# Patient Record
Sex: Female | Born: 1985 | Hispanic: Yes | Marital: Married | Smoking: Never smoker
Health system: Southern US, Community
[De-identification: ages and names within clinical notes are randomized; demographics above are authoritative.]

## PROBLEM LIST (undated history)

## (undated) DIAGNOSIS — E282 Polycystic ovarian syndrome: Secondary | ICD-10-CM

## (undated) DIAGNOSIS — I10 Essential (primary) hypertension: Secondary | ICD-10-CM

## (undated) DIAGNOSIS — J45909 Unspecified asthma, uncomplicated: Secondary | ICD-10-CM

## (undated) DIAGNOSIS — T7840XA Allergy, unspecified, initial encounter: Secondary | ICD-10-CM

## (undated) DIAGNOSIS — M9420 Chondromalacia, unspecified site: Secondary | ICD-10-CM

## (undated) HISTORY — DX: Essential (primary) hypertension: I10

## (undated) HISTORY — DX: Polycystic ovarian syndrome: E28.2

## (undated) HISTORY — DX: Allergy, unspecified, initial encounter: T78.40XA

## (undated) HISTORY — DX: Chondromalacia, unspecified site: M94.20

## (undated) HISTORY — DX: Unspecified asthma, uncomplicated: J45.909

---

## 2017-03-14 ENCOUNTER — Emergency Department (HOSPITAL_COMMUNITY): Payer: Self-pay

## 2017-03-14 ENCOUNTER — Emergency Department (HOSPITAL_COMMUNITY)
Admission: EM | Admit: 2017-03-14 | Discharge: 2017-03-14 | Disposition: A | Discharge status: Home / Self Care | Payer: Self-pay | Attending: Emergency Medicine | Admitting: Emergency Medicine

## 2017-03-14 ENCOUNTER — Encounter (HOSPITAL_COMMUNITY): Payer: Self-pay | Admitting: Emergency Medicine

## 2017-03-14 DIAGNOSIS — R1011 Right upper quadrant pain: Secondary | ICD-10-CM | POA: Insufficient documentation

## 2017-03-14 DIAGNOSIS — B2790 Infectious mononucleosis, unspecified without complication: Secondary | ICD-10-CM | POA: Insufficient documentation

## 2017-03-14 DIAGNOSIS — R17 Unspecified jaundice: Secondary | ICD-10-CM | POA: Insufficient documentation

## 2017-03-14 DIAGNOSIS — Z79899 Other long term (current) drug therapy: Secondary | ICD-10-CM | POA: Insufficient documentation

## 2017-03-14 LAB — URINALYSIS, ROUTINE W REFLEX MICROSCOPIC
BILIRUBIN URINE: NEGATIVE
GLUCOSE, UA: NEGATIVE mg/dL
HGB URINE DIPSTICK: NEGATIVE
Ketones, ur: NEGATIVE mg/dL
Leukocytes, UA: NEGATIVE
NITRITE: NEGATIVE
PH: 5 (ref 5.0–8.0)
Protein, ur: NEGATIVE mg/dL
SPECIFIC GRAVITY, URINE: 1.012 (ref 1.005–1.030)

## 2017-03-14 LAB — CBC WITH DIFFERENTIAL/PLATELET
Basophils Absolute: 0.5 10*3/uL — ABNORMAL HIGH (ref 0.0–0.1)
Basophils Relative: 3 %
Eosinophils Absolute: 0 10*3/uL (ref 0.0–0.7)
Eosinophils Relative: 0 %
HEMATOCRIT: 35.5 % — AB (ref 36.0–46.0)
HEMOGLOBIN: 11.5 g/dL — AB (ref 12.0–15.0)
LYMPHS ABS: 10.9 10*3/uL — AB (ref 0.7–4.0)
LYMPHS PCT: 70 %
MCH: 27.5 pg (ref 26.0–34.0)
MCHC: 32.4 g/dL (ref 30.0–36.0)
MCV: 84.9 fL (ref 78.0–100.0)
MONOS PCT: 10 %
Monocytes Absolute: 1.6 10*3/uL — ABNORMAL HIGH (ref 0.1–1.0)
NEUTROS ABS: 2.7 10*3/uL (ref 1.7–7.7)
Neutrophils Relative %: 17 %
Platelets: 326 10*3/uL (ref 150–400)
RBC: 4.18 MIL/uL (ref 3.87–5.11)
RDW: 15.4 % (ref 11.5–15.5)
WBC: 15.7 10*3/uL — ABNORMAL HIGH (ref 4.0–10.5)

## 2017-03-14 LAB — HEPATIC FUNCTION PANEL
ALBUMIN: 3.3 g/dL — AB (ref 3.5–5.0)
ALK PHOS: 282 U/L — AB (ref 38–126)
ALT: 185 U/L — ABNORMAL HIGH (ref 14–54)
AST: 107 U/L — ABNORMAL HIGH (ref 15–41)
BILIRUBIN DIRECT: 2 mg/dL — AB (ref 0.1–0.5)
BILIRUBIN TOTAL: 3.6 mg/dL — AB (ref 0.3–1.2)
Indirect Bilirubin: 1.6 mg/dL — ABNORMAL HIGH (ref 0.3–0.9)
Total Protein: 8.2 g/dL — ABNORMAL HIGH (ref 6.5–8.1)

## 2017-03-14 LAB — BASIC METABOLIC PANEL
Anion gap: 8 (ref 5–15)
BUN: 11 mg/dL (ref 6–20)
CALCIUM: 8.7 mg/dL — AB (ref 8.9–10.3)
CHLORIDE: 104 mmol/L (ref 101–111)
CO2: 24 mmol/L (ref 22–32)
Creatinine, Ser: 0.68 mg/dL (ref 0.44–1.00)
GFR calc Af Amer: >60 mL/min (ref >60)
GFR calc non Af Amer: >60 mL/min (ref >60)
Glucose, Bld: 81 mg/dL (ref 65–99)
Potassium: 3.8 mmol/L (ref 3.5–5.1)
SODIUM: 136 mmol/L (ref 135–145)

## 2017-03-14 LAB — POC URINE PREG, ED: PREG TEST UR: NEGATIVE

## 2017-03-14 LAB — LIPASE, BLOOD: Lipase: 30 U/L (ref 11–51)

## 2017-03-14 LAB — MONONUCLEOSIS SCREEN: Mono Screen: POSITIVE — AB

## 2017-03-14 MED ORDER — SODIUM CHLORIDE 0.9 % IV BOLUS (SEPSIS)
1000.0000 mL | Freq: Once | INTRAVENOUS | Status: AC
  Administered 2017-03-14: 1000 mL via INTRAVENOUS

## 2017-03-14 NOTE — ED Provider Notes (Signed)
[facility]-EMERGENCY DEPT Provider Note   CSN: [id number] Arrival date & time: 03/14/17  1023     History   Chief Complaint Chief Complaint  Patient presents with  . Abdominal Pain  . Jaundice    HPI Lisa Prince is a 31 y.o. female.  The history is provided by the patient. No language interpreter was used.  Abdominal Pain      Lisa Prince is a 31 y.o. female who presents to the Emergency Department complaining of abdominal pain, jaundice.  She presents for evaluation of yellowing of her skin and abdominal pain. 2 and half weeks ago she developed fevers to 102 for 2 weeks. At that time she had generalized body aches and malaise with mild cough and cervical lymphadenopathy. She was seen at urgent care and had Monospot screening, flu swab, strep swab that were negative. She had mild right-sided abdominal pain at that time. She will return to urgent care for persistent symptoms and was started on amoxicillin. She discontinued this medication on Sunday because she felt like she felt worse with the medication. Over the last week she noticed yellowing of her eyes and over the last several days she's had darkening of her urine and worsening right upper quadrant and right back pain. She has mild itching of her skin. She does take Tylenol, available over-the-counter and she is careful about making sure she does not take more than the recommended amount on the bottle. No history of IV drug use or recent procedures, no tattoos, no blood transfusions, no travel outside the country. She does work in a [employer], [employer].  History reviewed. No pertinent past medical history.  There are no active problems to display for this patient.   History reviewed. No pertinent surgical history.  OB History    No data available       Home Medications    Prior to Admission medications   Medication Sig Start Date End Date Taking? Authorizing Provider  Ascorbic Acid (VITAMIN C) 1000 MG tablet Take 2,000 mg by  mouth daily.   Yes Historical Provider, MD    Family History History reviewed. No pertinent family history.  Social History Social History  Substance Use Topics  . Smoking status: Never Smoker  . Smokeless tobacco: Not on file  . Alcohol use Yes     Comment: occasional     Allergies   Patient has no known allergies.   Review of Systems Review of Systems  Gastrointestinal: Positive for abdominal pain.  All other systems reviewed and are negative.    Physical Exam Updated Vital Signs BP (!) 148/74 (BP Location: Right Arm)   Pulse 94   Temp 98.2 F (36.8 C) (Oral)   Resp 19   Ht  (1.575 m)   Wt 250 lb (113.4 kg)   LMP 03/10/2017   SpO2 96%   BMI 45.73 kg/m   Physical Exam  Constitutional: She is oriented to person, place, and time. She appears well-developed and well-nourished.  HENT:  Head: Normocephalic and atraumatic.  Eyes: Scleral icterus is present.  Cardiovascular: Regular rhythm.   No murmur heard. Tachycardic  Pulmonary/Chest: Effort normal and breath sounds normal. No respiratory distress.  Abdominal: Soft. There is no rebound and no guarding.  Mild right upper quadrant tenderness  Musculoskeletal: She exhibits no edema or tenderness.  Neurological: She is alert and oriented to person, place, and time.  Skin: Skin is warm and dry.  Jaundice  Psychiatric: She has a normal mood and affect. Her  behavior is normal.  Nursing note and vitals reviewed.    ED Treatments / Results  Labs (all labs ordered are listed, but only abnormal results are displayed) Labs Reviewed  CBC WITH DIFFERENTIAL/PLATELET - Abnormal; Notable for the following:       Result Value   WBC 15.7 (*)    Hemoglobin 11.5 (*)    HCT 35.5 (*)    Lymphs Abs 10.9 (*)    Monocytes Absolute 1.6 (*)    Basophils Absolute 0.5 (*)    All other components within normal limits  URINALYSIS, ROUTINE W REFLEX MICROSCOPIC - Abnormal; Notable for the following:    Color, Urine AMBER  (*)    All other components within normal limits  HEPATIC FUNCTION PANEL - Abnormal; Notable for the following:    Total Protein 8.2 (*)    Albumin 3.3 (*)    AST 107 (*)    ALT 185 (*)    Alkaline Phosphatase 282 (*)    Total Bilirubin 3.6 (*)    Bilirubin, Direct 2.0 (*)    Indirect Bilirubin 1.6 (*)    All other components within normal limits  BASIC METABOLIC PANEL - Abnormal; Notable for the following:    Calcium 8.7 (*)    All other components within normal limits  MONONUCLEOSIS SCREEN - Abnormal; Notable for the following:    Mono Screen POSITIVE (*)    All other components within normal limits  LIPASE, BLOOD  HEPATITIS PANEL, ACUTE  PATHOLOGIST SMEAR REVIEW  POC URINE PREG, ED    EKG  EKG Interpretation None       Radiology US Abdomen Limited Ruq  Result Date: 03/14/2017 CLINICAL DATA:  Jaundice EXAM: US ABDOMEN LIMITED - RIGHT UPPER QUADRANT COMPARISON:  None. FINDINGS: Gallbladder: Contracted gallbladder. No gallbladder wall thickening or pericholecystic fluid. Common bile duct: Diameter: 4 mm Liver: No focal lesion identified. Within normal limits in parenchymal echogenicity. IMPRESSION: Negative right upper quadrant ultrasound. Electronically Signed   By: Charline Bills M.D.   On: 03/14/2017 12:06    Procedures Procedures (including critical care time)  Medications Ordered in ED Medications  sodium chloride 0.9 % bolus 1,000 mL (0 mLs Intravenous Stopped 03/14/17 1407)     Initial Impression / Assessment and Plan / ED Course  I have reviewed the triage vital signs and the nursing notes.  Pertinent labs & imaging results that were available during my care of the patient were reviewed by me and considered in my medical decision making (see chart for details).     Pt here for evaluation of RUQ pain, jaundice.  She is nontoxic on exam with mild abdominal tenderness.  Labs with leukocytosis, elevation in bilirubin.  Monospot positive today, was negative a  week ago.  Presentation is c/w jaundice secondary to acute mono.  Discussed avoiding ETOH, acetaminophen.  Plan to d/c home with close outpatient follow up and return precautions.    Final Clinical Impressions(s) / ED Diagnoses   Final diagnoses:  Jaundice  Mononucleosis    New Prescriptions Discharge Medication List as of 03/14/2017  1:59 PM       Tilden Fossa, MD 03/14/17 1420

## 2017-03-14 NOTE — ED Triage Notes (Signed)
Pt report she had a fever 2 weeks ago which has resolved after taking abx Pt had RUQ pain a few days ago that has gotten slightly better. Pt also noticed yellow in her eyes, dark urine, and itching for the past few days. Still has gallbladder. No known hx of liver issues.

## 2017-03-14 NOTE — ED Notes (Signed)
Pt taken to U/S via wc prior to labs being drawn.

## 2017-03-14 NOTE — Discharge Instructions (Signed)
Do not take acetaminophen containing products.  Do not drink alcohol.

## 2017-03-14 NOTE — ED Notes (Signed)
Pt ambulatory and independent at discharge.  Verbalized understanding of discharge instructions 

## 2017-03-15 LAB — PATHOLOGIST SMEAR REVIEW

## 2017-03-15 LAB — HEPATITIS PANEL, ACUTE
HCV Ab: 0.1 s/co ratio (ref 0.0–0.9)
HEP B C IGM: NEGATIVE
Hep A IgM: NEGATIVE
Hepatitis B Surface Ag: NEGATIVE

## 2017-12-30 IMAGING — US US ABDOMEN LIMITED
1 series · 14 of 25 positions shown · non-contrast
Comparison: None.

CLINICAL DATA: Jaundice

EXAM:
US ABDOMEN LIMITED - RIGHT UPPER QUADRANT

[Series 1: us abdomen limited · 0.25mm/px · 14 of 61 slices shown]
[im 1/61]
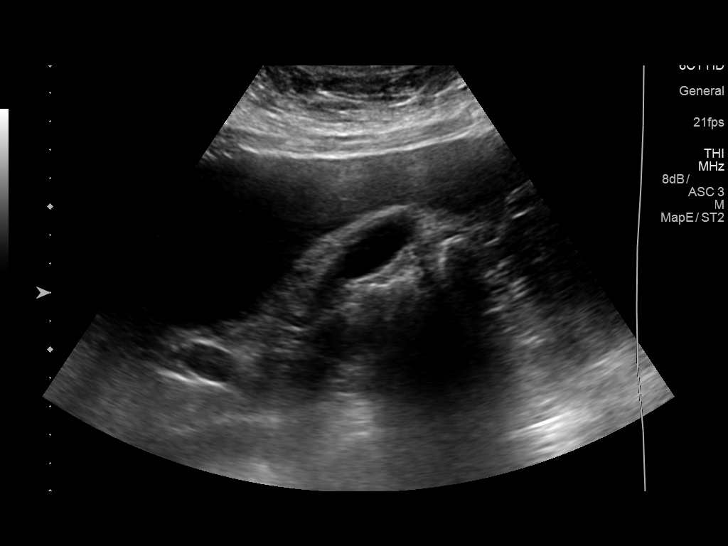
[im 6/61]
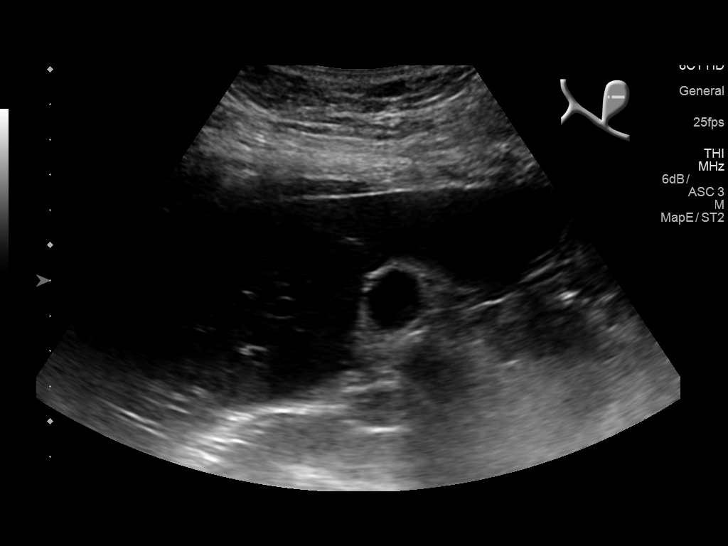
[im 11/61]
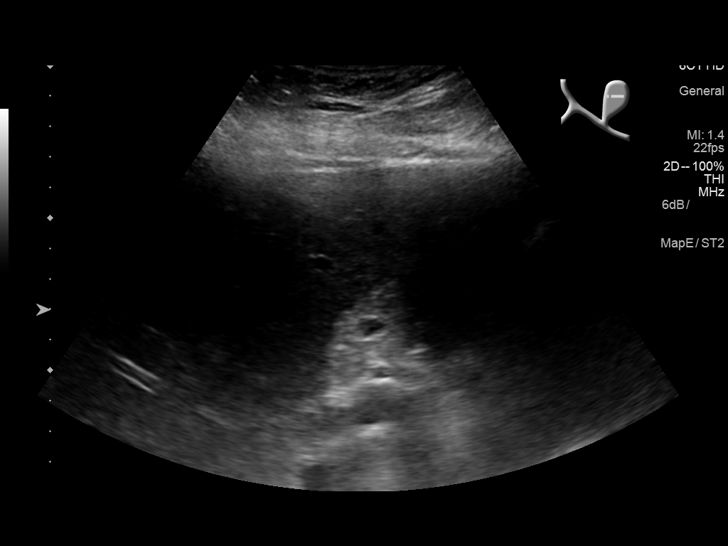
[im 16/61]
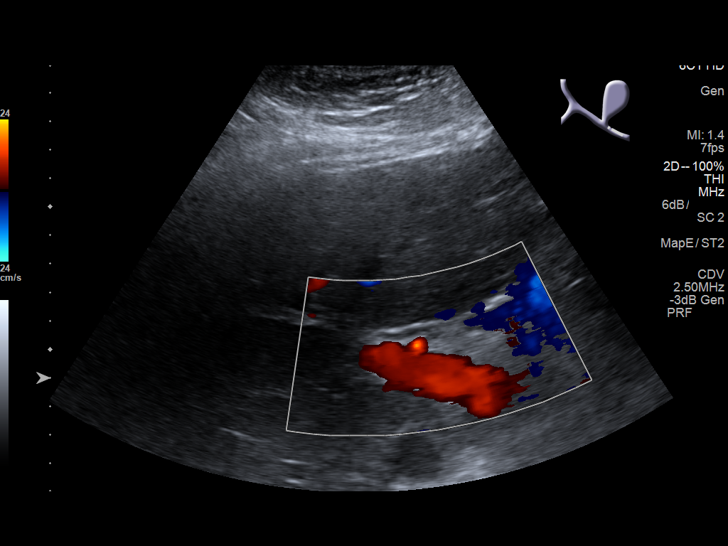
[im 21/61]
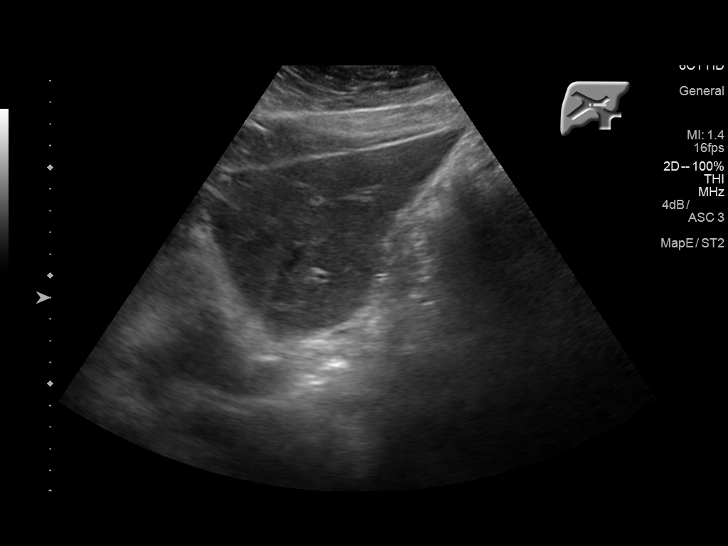
[im 23/61]
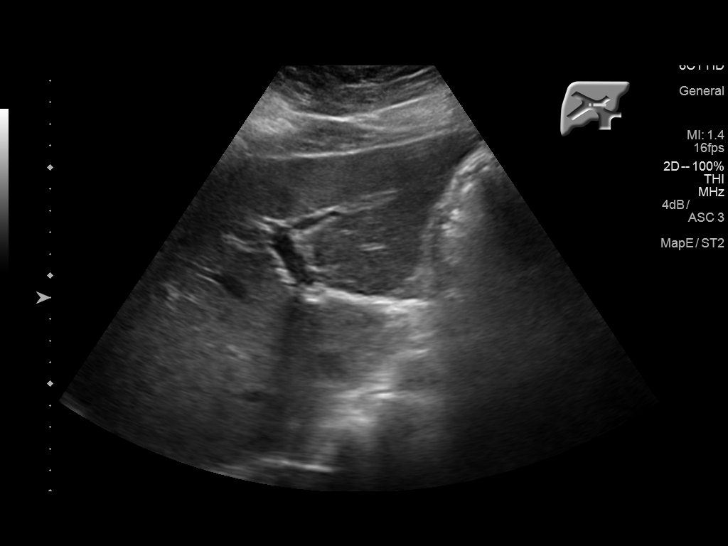
[im 28/61]
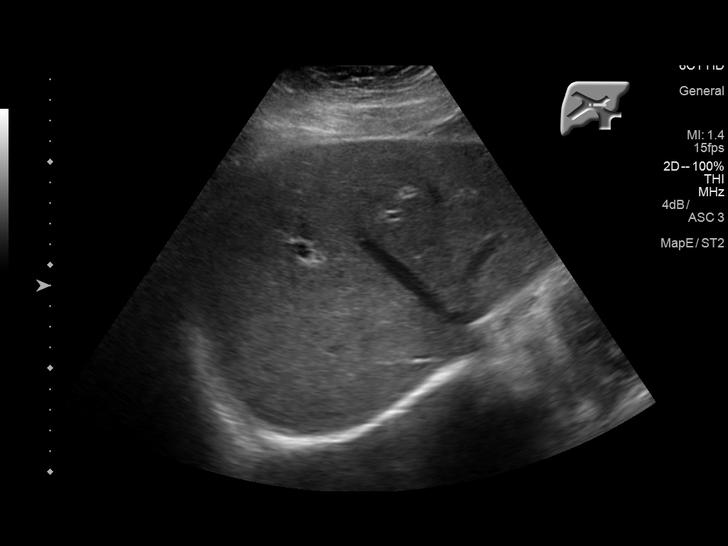
[im 33/61]
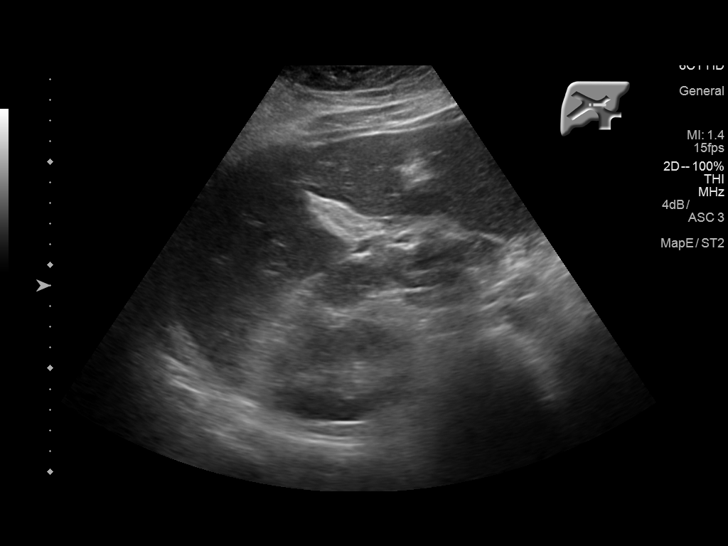
[im 38/61]
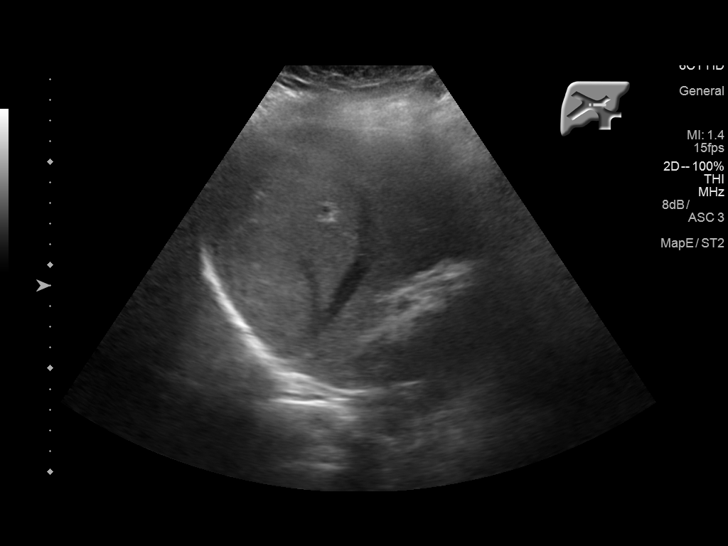
[im 41/61]
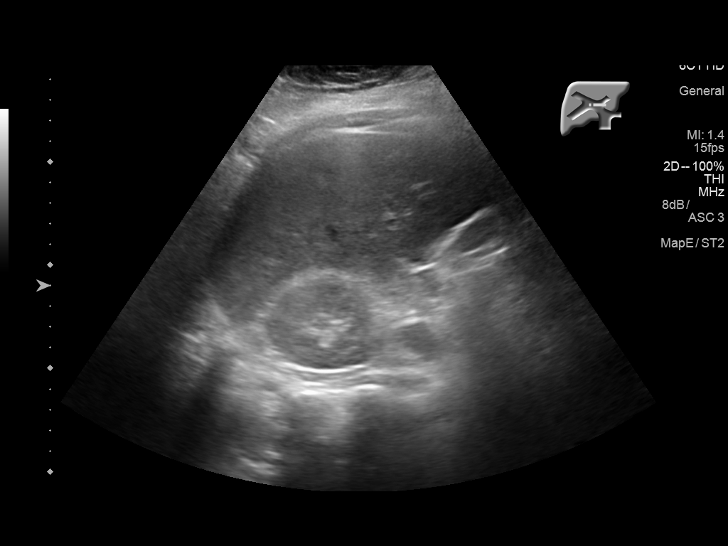
[im 46/61]
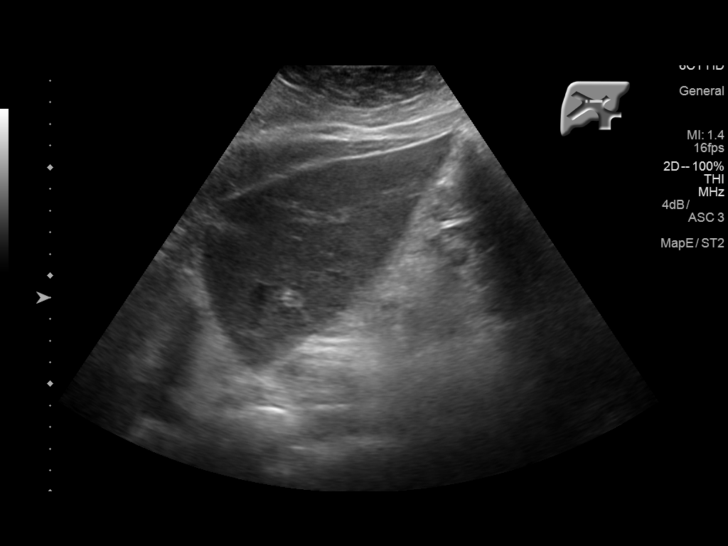
[im 51/61]
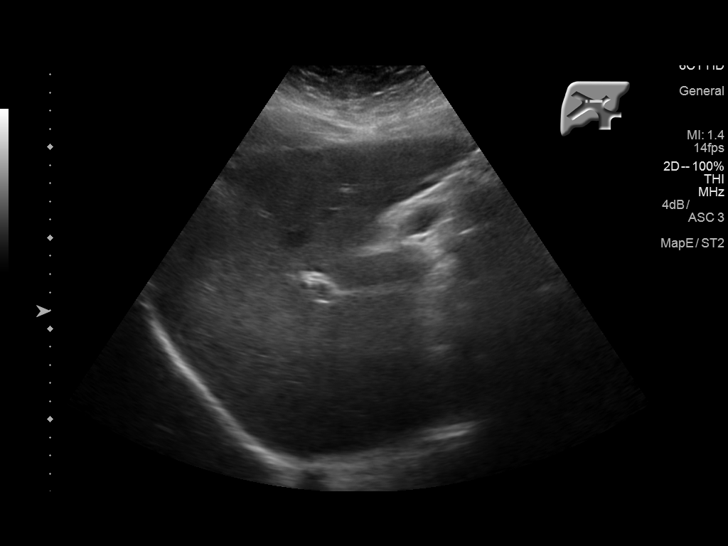
[im 56/61]
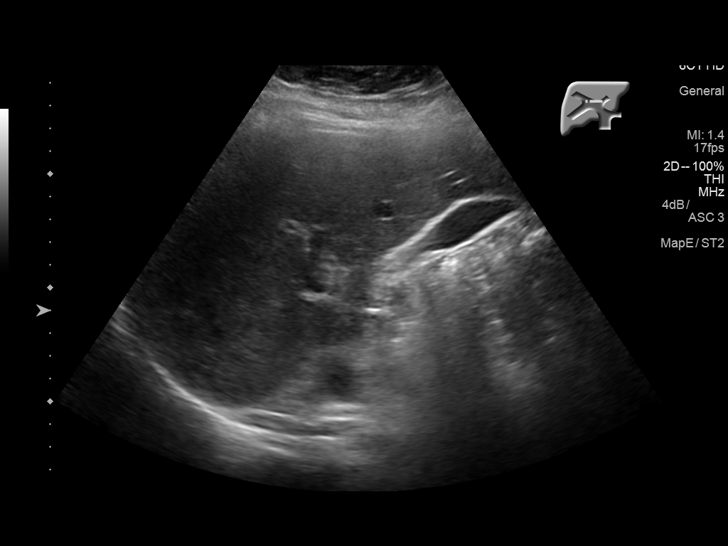
[im 61/61]
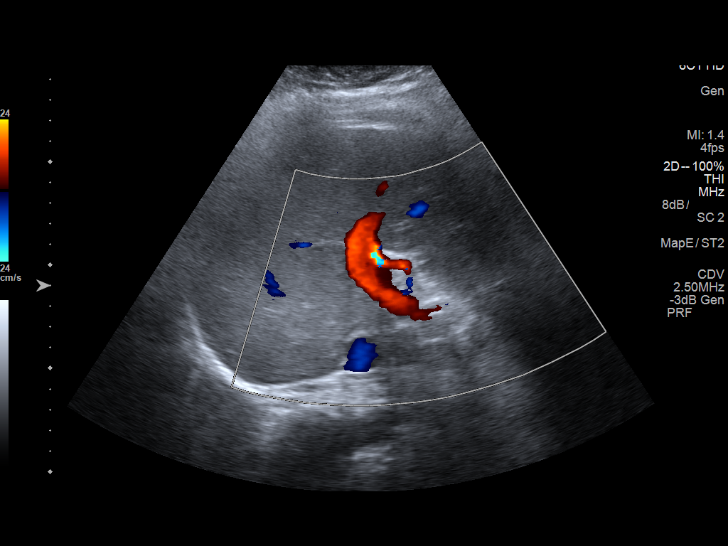

[14 of 25 positions shown; findings below may reference images not displayed]

FINDINGS: Gallbladder:

Contracted gallbladder. No gallbladder wall thickening or
pericholecystic fluid.

Common bile duct:

Diameter: 4 mm

Liver:

No focal lesion identified. Within normal limits in parenchymal
echogenicity.
IMPRESSION: Negative right upper quadrant ultrasound.

## 2018-12-07 ENCOUNTER — Emergency Department (HOSPITAL_COMMUNITY)
Admission: EM | Admit: 2018-12-07 | Discharge: 2018-12-07 | Disposition: A | Discharge status: Home / Self Care | Payer: Managed Care, Other (non HMO) | Attending: Emergency Medicine | Admitting: Emergency Medicine

## 2018-12-07 ENCOUNTER — Encounter (HOSPITAL_COMMUNITY): Payer: Self-pay

## 2018-12-07 ENCOUNTER — Other Ambulatory Visit: Payer: Self-pay

## 2018-12-07 DIAGNOSIS — I10 Essential (primary) hypertension: Secondary | ICD-10-CM | POA: Diagnosis not present

## 2018-12-07 DIAGNOSIS — H9201 Otalgia, right ear: Secondary | ICD-10-CM | POA: Diagnosis not present

## 2018-12-07 DIAGNOSIS — H6091 Unspecified otitis externa, right ear: Secondary | ICD-10-CM

## 2018-12-07 LAB — I-STAT CHEM 8, ED
BUN: 10 mg/dL (ref 6–20)
CHLORIDE: 103 mmol/L (ref 98–111)
CREATININE: 0.6 mg/dL (ref 0.44–1.00)
Calcium, Ion: 1.1 mmol/L — ABNORMAL LOW (ref 1.15–1.40)
Glucose, Bld: 96 mg/dL (ref 70–99)
HCT: 40 % (ref 36.0–46.0)
Hemoglobin: 13.6 g/dL (ref 12.0–15.0)
POTASSIUM: 3.7 mmol/L (ref 3.5–5.1)
SODIUM: 139 mmol/L (ref 135–145)
TCO2: 25 mmol/L (ref 22–32)

## 2018-12-07 MED ORDER — AMOXICILLIN-POT CLAVULANATE 875-125 MG PO TABS: 1.0000 | ORAL_TABLET | Freq: Two times a day (BID) | ORAL | 0 refills | Status: DC

## 2018-12-07 MED ORDER — CIPROFLOXACIN-DEXAMETHASONE 0.3-0.1 % OT SUSP
4.0000 [drp] | Freq: Once | OTIC | Status: AC
  Administered 2018-12-07: 4 [drp] via OTIC
  Filled 2018-12-07: qty 7.5

## 2018-12-07 MED ORDER — AMOXICILLIN-POT CLAVULANATE 875-125 MG PO TABS
1.0000 | ORAL_TABLET | Freq: Once | ORAL | Status: AC
  Administered 2018-12-07: 1 via ORAL
  Filled 2018-12-07: qty 1

## 2018-12-07 MED ORDER — OXYCODONE-ACETAMINOPHEN 5-325 MG PO TABS
2.0000 | ORAL_TABLET | Freq: Once | ORAL | Status: AC
  Administered 2018-12-07: 2 via ORAL
  Filled 2018-12-07: qty 2

## 2018-12-07 MED ORDER — CHLORTHALIDONE 25 MG PO TABS
25.0000 mg | ORAL_TABLET | Freq: Every day | ORAL | Status: DC
  Filled 2018-12-07: qty 1

## 2018-12-07 MED ORDER — CIPROFLOXACIN-HYDROCORTISONE 0.2-1 % OT SUSP: 3.0000 [drp] | Freq: Two times a day (BID) | OTIC | 0 refills | Status: DC

## 2018-12-07 MED ORDER — HYDROCHLOROTHIAZIDE 12.5 MG PO CAPS
12.5000 mg | ORAL_CAPSULE | Freq: Once | ORAL | Status: AC
  Administered 2018-12-07: 12.5 mg via ORAL
  Filled 2018-12-07: qty 1

## 2018-12-07 MED ORDER — HYDROCHLOROTHIAZIDE 25 MG PO TABS: 25.0000 mg | ORAL_TABLET | Freq: Every day | ORAL | 0 refills | Status: DC

## 2018-12-07 NOTE — ED Triage Notes (Deleted)
Note text

## 2018-12-07 NOTE — ED Notes (Signed)
PT DISCHARGED. INSTRUCTIONS GIVEN. AAOX4. PT IN NO APPARENT DISTRESS WITH MODERATE PAIN. THE OPPORTUNITY TO ASK QUESTIONS WAS PROVIDED. 

## 2018-12-07 NOTE — ED Triage Notes (Signed)
Pt was seen this evening at Urgent Care for an ear infection when it was discovered that her blood pressure was elevated, two times it was 175/124 and 169/120, she was sent here to further evaluate her blood pressure. She had a prescription called in for her ear but the pharmacy is now closed.

## 2018-12-07 NOTE — ED Provider Notes (Signed)
[facility]-EMERGENCY DEPT Provider Note   CSN: [id number] Arrival date & time: 12/07/18  1914     History   Chief Complaint No chief complaint on file.   HPI Talmage CoinChristina Doutt is a 32 y.o. female.  HPI  32 year old female presents today complaining of left ear pain.  States pain began several days ago.  Pain has increased and she felt a popping sensation with opening her mouth.  She has some radiation of the pain down into her jaw.  No definite fever or chills although she has had some nasal congestion  History reviewed. No pertinent past medical history.  There are no active problems to display for this patient.   History reviewed. No pertinent surgical history.   OB History   No obstetric history on file.      Home Medications    Prior to Admission medications   Medication Sig Start Date End Date Taking? Authorizing Provider  Ascorbic Acid (VITAMIN C) 1000 MG tablet Take 2,000 mg by mouth daily.    [provider]    Family History History reviewed. No pertinent family history.  Social History Social History   Tobacco Use  . Smoking status: Never Smoker  . Smokeless tobacco: Never Used  Substance Use Topics  . Alcohol use: Yes    Comment: occasional  . Drug use: Never     Allergies   Patient has no known allergies.   Review of Systems Review of Systems  All other systems reviewed and are negative.    Physical Exam Updated Vital Signs BP (!) 168/117 (BP Location: Right Arm)   Pulse (!) 108   Temp 99.2 F (37.3 C) (Oral)   Resp 16   Ht 1.575 m (5\' 2" )   Wt 127 kg   LMP 11/07/2018   SpO2 98%   BMI 51.21 kg/m   Physical Exam Vitals signs and nursing note reviewed.  Constitutional:      General: She is not in acute distress.    Appearance: She is well-developed.  HENT:     Head: Normocephalic and atraumatic.     Right Ear: External ear normal.     Left Ear: External ear normal.     Ears:   Comments: Left tm with serous fluid and poor landmarks    Nose: Nose normal.  Eyes:     Conjunctiva/sclera: Conjunctivae normal.     Pupils: Pupils are equal, round, and reactive to light.  Neck:     Musculoskeletal: Normal range of motion and neck supple.  Pulmonary:     Effort: Pulmonary effort is normal.  Musculoskeletal: Normal range of motion.  Skin:    General: Skin is warm and dry.  Neurological:     Mental Status: She is alert and oriented to person, place, and time.     Motor: No abnormal muscle tone.     Coordination: Coordination normal.  Psychiatric:        Behavior: Behavior normal.        Thought Content: Thought content normal.      ED Treatments / Results  Labs (all labs ordered are listed, but only abnormal results are displayed) Labs Reviewed  I-STAT CHEM 8, ED - Abnormal; Notable for the following components:      Result Value   Calcium, Ion 1.10 (*)    All other components within normal limits    EKG None  Radiology No results found.  Procedures Procedures (including critical care time)  Medications Ordered  in ED Medications  oxyCODONE-acetaminophen (PERCOCET/ROXICET) 5-325 MG per tablet 2 tablet (2 tablets Oral Given 12/07/18 2204)  amoxicillin-clavulanate (AUGMENTIN) 875-125 MG per tablet 1 tablet (1 tablet Oral Given 12/07/18 2204)  ciprofloxacin-dexamethasone (CIPRODEX) 0.3-0.1 % OTIC (EAR) suspension 4 drop (4 drops Right EAR Given 12/07/18 2203)  hydrochlorothiazide (MICROZIDE) capsule 12.5 mg (12.5 mg Oral Given 12/07/18 2214)     Initial Impression / Assessment and Plan / ED Course  I have reviewed the triage vital signs and the nursing notes.  Pertinent labs & imaging results that were available during my care of the patient were reviewed by me and considered in my medical decision making (see chart for details).     Final Clinical Impressions(s) / ED Diagnoses   Final diagnoses:  Hypertension, unspecified type  Otitis externa  of right ear, unspecified chronicity, unspecified type    ED Discharge Orders    None       Margarita Grizzleay, Foye Damron, MD 12/13/18 1444

## 2018-12-07 NOTE — ED Triage Notes (Signed)
Pt was seen at Urgent Care this evening for an ear infection and it was discovered that her blood pressure was elevated, two times it was 175/124 and 169/120, she was sent here for further evaluation of her blood pressure The sent a prescription in to her pharmacy for her ear which is now closed

## 2018-12-07 NOTE — Discharge Instructions (Addendum)
Please take medications as prescribed. Recheck of blood pressure with a physician in 1-2 weeks

## 2020-02-20 ENCOUNTER — Other Ambulatory Visit: Payer: Self-pay | Admitting: Physician Assistant

## 2020-02-21 ENCOUNTER — Other Ambulatory Visit: Payer: Self-pay | Admitting: Physician Assistant

## 2020-02-21 DIAGNOSIS — R1012 Left upper quadrant pain: Secondary | ICD-10-CM

## 2021-12-12 HISTORY — PX: OOPHORECTOMY: SHX86

## 2022-07-25 LAB — HM PAP SMEAR: HM Pap smear: NORMAL

## 2022-08-23 ENCOUNTER — Ambulatory Visit (INDEPENDENT_AMBULATORY_CARE_PROVIDER_SITE_OTHER): Payer: Self-pay | Admitting: Physician Assistant

## 2022-08-23 ENCOUNTER — Encounter: Payer: Self-pay | Admitting: Physician Assistant

## 2022-08-23 VITALS — BP 128/80 | HR 89 | Temp 98.3°F | Ht 63.0 in | Wt 304.2 lb

## 2022-08-23 DIAGNOSIS — H6122 Impacted cerumen, left ear: Secondary | ICD-10-CM

## 2022-08-23 NOTE — Progress Notes (Signed)
[facility] [address] [place], [place] [address]   Office Visit Note  Patient Name: Lisa Prince Date of Birth [id number]  Medical Record number [id number]  Date of Service: 08/23/2022  Chief Complaint  Patient presents with   Ear Pain    Left ear pain. Woke her up this morning. Feels swollen and tight and running down into her neck. Feels like she has some sinus pressure on left side. Has history of ear issues.      36 y/o F presents to the clinic for c/o left ear pain x earlier today at around 6 am. Pt woke up from pain in the left ear. She denies any recent open water activity, air travel, ear drainage, nausea, vomiting, dizziness, TMJ disorder, or any recent dental procedures. Feels like "fullness" in the left ear. She has h/o multiple ear infections as a child, but not recently. She denies fever, CP, or SOB. She has slight nasal congestion, but no URI symptoms.       Current Medication:  Outpatient Encounter Medications as of 08/23/2022  Medication Sig   Ascorbic Acid (VITAMIN C) 1000 MG tablet Take 2,000 mg by mouth daily.   [DISCONTINUED] amoxicillin-clavulanate (AUGMENTIN) 875-125 MG tablet Take 1 tablet by mouth every 12 (twelve) hours. (Patient not taking: Reported on 08/23/2022)   [DISCONTINUED] ciprofloxacin-hydrocortisone (CIPRO HC) OTIC suspension Place 3 drops into the right ear 2 (two) times daily. (Patient not taking: Reported on 08/23/2022)   [DISCONTINUED] hydrochlorothiazide (HYDRODIURIL) 25 MG tablet Take 1 tablet (25 mg total) by mouth daily. (Patient not taking: Reported on 08/23/2022)   No facility-administered encounter medications on file as of 08/23/2022.      Medical History: No past medical history on file.   Vital Signs: BP 128/80 (BP Location: Left Arm, Patient Position: Sitting, Cuff Size: Large)   Pulse 89   Temp 98.3 F (36.8 C) (Tympanic)   Ht 5\' 3"  (1.6 m)   Wt (!) 304 lb 3.2 oz (138 kg)   SpO2 98%   BMI 53.89 kg/m     Review of Systems  Constitutional: Negative.   HENT:  Positive for ear pain and sinus pressure. Negative for dental problem, ear discharge, sore throat and trouble swallowing.   Eyes: Negative.   Respiratory: Negative.    Cardiovascular: Negative.   Neurological: Negative.     Physical Exam Constitutional:      Appearance: Normal appearance.  HENT:     Head: Atraumatic.     Right Ear: Tympanic membrane, ear canal and external ear normal. There is no impacted cerumen.     Left Ear: Tympanic membrane normal. There is impacted cerumen.     Ears:     Comments: Post Ear Irrigation of L ear canal- L TM appears normal.     Nose: Nose normal.     Mouth/Throat:     Mouth: Mucous membranes are moist.     Pharynx: Oropharynx is clear.  Eyes:     Extraocular Movements: Extraocular movements intact.  Cardiovascular:     Rate and Rhythm: Normal rate and regular rhythm.  Pulmonary:     Effort: Pulmonary effort is normal.     Breath sounds: Normal breath sounds.  Musculoskeletal:     Cervical back: Neck supple.  Skin:    General: Skin is warm.  Neurological:     Mental Status: She is alert.  Psychiatric:        Mood and Affect: Mood normal.  Behavior: Behavior normal.        Thought Content: Thought content normal.        Judgment: Judgment normal.       Assessment/Plan:   ICD-10-CM   1. Impacted cerumen of left ear  H61.22 EAR CERUMEN REMOVAL       General Counseling: [name] verbalizes understanding of the findings of todays visit and agrees with plan of treatment. I have discussed any further diagnostic evaluation that may be needed or ordered today. We also reviewed her medications today. she has been encouraged to call the office with any questions or concerns that should arise related to todays visit.   Orders Placed This Encounter  Procedures   EAR CERUMEN REMOVAL    Reviewed my clinical findings with patient.  LTM appears normal after ear lavage.   Recommended to use otc nasal spray ie Flonase or Nosonex. Consider using oral decongestant ie Pseudoephedrine to help dry up sinuses.  If symptoms don't improve or worsen then RTC for further evaluation. Pt verbalized understanding and in agreement.  Today's visit is a 30 minute F2F encounter.  Time spent:30 Minutes    Gilberto Better, [place] Physician Assistant

## 2023-01-09 ENCOUNTER — Ambulatory Visit (INDEPENDENT_AMBULATORY_CARE_PROVIDER_SITE_OTHER): Payer: Self-pay | Admitting: Physician Assistant

## 2023-01-09 ENCOUNTER — Encounter: Payer: Self-pay | Admitting: Physician Assistant

## 2023-01-09 VITALS — HR 104 | Temp 98.7°F | Wt 304.0 lb

## 2023-01-09 DIAGNOSIS — R062 Wheezing: Secondary | ICD-10-CM

## 2023-01-09 DIAGNOSIS — J22 Unspecified acute lower respiratory infection: Secondary | ICD-10-CM

## 2023-01-09 MED ORDER — AMOXICILLIN-POT CLAVULANATE 875-125 MG PO TABS: 1.0000 | ORAL_TABLET | Freq: Two times a day (BID) | ORAL | 0 refills | Status: AC

## 2023-01-09 MED ORDER — METHYLPREDNISOLONE 4 MG PO TBPK: ORAL_TABLET | ORAL | 0 refills | Status: DC

## 2023-01-09 NOTE — Progress Notes (Signed)
[facility] [address], [place] [address]   Office Visit Note  Patient Name: Lisa Prince Date of Birth [id number]  Medical Record number [id number]  Date of Service: 01/09/2023  Chief Complaint  Patient presents with   Cough    Had flu like symptoms last week. Now cough and congestion is what is bothering her. Congestion started Fri. Left side tonsil swollen noticed Sat.      37 y/o F presents to the clinic for c/o chest congestion, wheezing x few days. Pt's symptoms of sore throat, post nasal drainage, fever, cough started one week ago, but now feels better. Noticed congestion in the chest has worsened and has trouble sleeping at night with wheezing   Cough Associated symptoms include wheezing. Pertinent negatives include no shortness of breath.      Current Medication:  Outpatient Encounter Medications as of 01/09/2023  Medication Sig   amoxicillin-clavulanate (AUGMENTIN) 875-125 MG tablet Take 1 tablet by mouth 2 (two) times daily for 5 days.   Ascorbic Acid (VITAMIN C) 1000 MG tablet Take 2,000 mg by mouth daily.   methylPREDNISolone (MEDROL DOSEPAK) 4 MG TBPK tablet Take as directed   No facility-administered encounter medications on file as of 01/09/2023.      Medical History: No past medical history on file.   Vital Signs: Pulse (!) 104   Temp 98.7 F (37.1 C) (Tympanic)   Wt (!) 304 lb (137.9 kg)   SpO2 98%   BMI 53.85 kg/m    Review of Systems  Constitutional: Negative.   HENT:  Positive for congestion.   Respiratory:  Positive for cough, chest tightness and wheezing. Negative for shortness of breath.   Neurological: Negative.     Physical Exam Constitutional:      Appearance: Normal appearance.  HENT:     Head: Atraumatic.     Right Ear: Ear canal and external ear normal.     Left Ear: Tympanic membrane, ear canal and external ear normal.     Ears:     Comments: + R TM appears dull with air-fluid level.  L TM partially  occluded with cerumen, but partial TM appears normal.     Nose: Nose normal.     Mouth/Throat:     Mouth: Mucous membranes are moist.     Pharynx: Oropharynx is clear.  Eyes:     Extraocular Movements: Extraocular movements intact.  Cardiovascular:     Rate and Rhythm: Normal rate and regular rhythm.  Pulmonary:     Effort: Pulmonary effort is normal.     Breath sounds: Wheezing (+expiraotry wheezing) present.  Musculoskeletal:     Cervical back: Neck supple.  Skin:    General: Skin is warm.  Neurological:     Mental Status: She is alert.  Psychiatric:        Mood and Affect: Mood normal.        Behavior: Behavior normal.        Thought Content: Thought content normal.        Judgment: Judgment normal.       Assessment/Plan:  1. Lower respiratory infection - amoxicillin-clavulanate (AUGMENTIN) 875-125 MG tablet; Take 1 tablet by mouth 2 (two) times daily for 5 days.  Dispense: 10 tablet; Refill: 0  2. Wheezing - methylPREDNISolone (MEDROL DOSEPAK) 4 MG TBPK tablet; Take as directed  Dispense: 1 each; Refill: 0  Increase fluids Start a humidifier May continue with Mucinex as directed on the box. Start oral steroids as  prescribed, but if symptoms don't improve then consider starting oral antibiotic as prescribed. Pt verbalized understanding and in agreement.  RTC prn   General Counseling: Bernardette verbalizes understanding of the findings of todays visit and agrees with plan of treatment. I have discussed any further diagnostic evaluation that may be needed or ordered today. We also reviewed her medications today. she has been encouraged to call the office with any questions or concerns that should arise related to todays visit.    Time spent:20 [place], [place] Physician Assistant

## 2023-07-04 ENCOUNTER — Ambulatory Visit: Payer: BC Managed Care – PPO | Admitting: Nurse Practitioner

## 2023-07-26 ENCOUNTER — Ambulatory Visit: Payer: BC Managed Care – PPO | Admitting: Nurse Practitioner

## 2023-07-26 ENCOUNTER — Encounter: Payer: Self-pay | Admitting: Nurse Practitioner

## 2023-07-26 VITALS — BP 150/100 | HR 97 | Temp 97.8°F | Ht 62.0 in | Wt 315.0 lb

## 2023-07-26 DIAGNOSIS — D649 Anemia, unspecified: Secondary | ICD-10-CM

## 2023-07-26 DIAGNOSIS — J45909 Unspecified asthma, uncomplicated: Secondary | ICD-10-CM | POA: Insufficient documentation

## 2023-07-26 DIAGNOSIS — F32A Depression, unspecified: Secondary | ICD-10-CM | POA: Diagnosis not present

## 2023-07-26 DIAGNOSIS — F419 Anxiety disorder, unspecified: Secondary | ICD-10-CM

## 2023-07-26 DIAGNOSIS — E282 Polycystic ovarian syndrome: Secondary | ICD-10-CM

## 2023-07-26 DIAGNOSIS — J4520 Mild intermittent asthma, uncomplicated: Secondary | ICD-10-CM

## 2023-07-26 DIAGNOSIS — I10 Essential (primary) hypertension: Secondary | ICD-10-CM | POA: Diagnosis not present

## 2023-07-26 DIAGNOSIS — Z114 Encounter for screening for human immunodeficiency virus [HIV]: Secondary | ICD-10-CM

## 2023-07-26 DIAGNOSIS — M9420 Chondromalacia, unspecified site: Secondary | ICD-10-CM

## 2023-07-26 LAB — COMPREHENSIVE METABOLIC PANEL
ALT: 29 U/L (ref 0–35)
AST: 20 U/L (ref 0–37)
Albumin: 4 g/dL (ref 3.5–5.2)
Alkaline Phosphatase: 92 U/L (ref 39–117)
BUN: 13 mg/dL (ref 6–23)
CO2: 30 mEq/L (ref 19–32)
Calcium: 9 mg/dL (ref 8.4–10.5)
Chloride: 102 mEq/L (ref 96–112)
Creatinine, Ser: 0.65 mg/dL (ref 0.40–1.20)
GFR: 114.33 mL/min (ref >60.00)
Glucose, Bld: 85 mg/dL (ref 70–99)
Potassium: 4.3 mEq/L (ref 3.5–5.1)
Sodium: 139 mEq/L (ref 135–145)
Total Bilirubin: 0.4 mg/dL (ref 0.2–1.2)
Total Protein: 7.3 g/dL (ref 6.0–8.3)

## 2023-07-26 LAB — CBC WITH DIFFERENTIAL/PLATELET
Basophils Absolute: 0 10*3/uL (ref 0.0–0.1)
Basophils Relative: 0.4 % (ref 0.0–3.0)
Eosinophils Absolute: 0.3 10*3/uL (ref 0.0–0.7)
Eosinophils Relative: 2.8 % (ref 0.0–5.0)
HCT: 38.2 % (ref 36.0–46.0)
Hemoglobin: 12.1 g/dL (ref 12.0–15.0)
Lymphocytes Relative: 17.5 % (ref 12.0–46.0)
Lymphs Abs: 1.8 10*3/uL (ref 0.7–4.0)
MCHC: 31.6 g/dL (ref 30.0–36.0)
MCV: 82.4 fl (ref 78.0–100.0)
Monocytes Absolute: 0.6 10*3/uL (ref 0.1–1.0)
Monocytes Relative: 5.5 % (ref 3.0–12.0)
Neutro Abs: 7.8 10*3/uL — ABNORMAL HIGH (ref 1.4–7.7)
Neutrophils Relative %: 73.8 % (ref 43.0–77.0)
Platelets: 365 10*3/uL (ref 150.0–400.0)
RBC: 4.64 Mil/uL (ref 3.87–5.11)
RDW: 14.9 % (ref 11.5–15.5)
WBC: 10.6 10*3/uL — ABNORMAL HIGH (ref 4.0–10.5)

## 2023-07-26 LAB — VITAMIN B12: Vitamin B-12: 240 pg/mL (ref 211–911)

## 2023-07-26 LAB — LIPID PANEL
Cholesterol: 163 mg/dL (ref 0–200)
HDL: 38.2 mg/dL — ABNORMAL LOW (ref >39.00)
LDL Cholesterol: 97 mg/dL (ref 0–99)
NonHDL: 125.23
Total CHOL/HDL Ratio: 4
Triglycerides: 141 mg/dL (ref 0.0–149.0)
VLDL: 28.2 mg/dL (ref 0.0–40.0)

## 2023-07-26 LAB — TSH: TSH: 2.64 u[IU]/mL (ref 0.35–5.50)

## 2023-07-26 LAB — HEMOGLOBIN A1C: Hgb A1c MFr Bld: 6.5 % (ref 4.6–6.5)

## 2023-07-26 LAB — VITAMIN D 25 HYDROXY (VIT D DEFICIENCY, FRACTURES): VITD: 10.51 ng/mL — ABNORMAL LOW (ref 30.00–100.00)

## 2023-07-26 LAB — TESTOSTERONE: Testosterone: 46.22 ng/dL — ABNORMAL HIGH (ref 15.00–40.00)

## 2023-07-26 NOTE — Assessment & Plan Note (Signed)
Chronic, not controlled.  She has been diagnosed with PCOS since she was in her 49s.  She states that her menstrual periods can be very irregular, heavy, and then sometimes she will skip months.  She ended up needing her right ovary removed along with the cyst last year.  Since then she has been having some hot flashes, mood swings, fatigue.  Will check FSH, LH, testosterone, TSH today.  Discussed starting birth control or metformin for her symptoms, she would like to hold off at this point.  Follow-up in 2 months.

## 2023-07-26 NOTE — Assessment & Plan Note (Signed)
Chronic, stable.  Her blood pressure has been fluctuating over the years, however she states that she has not taken medication for this.  BP is slightly elevated today at 150/100.  Discussed checking blood pressure at home, limiting salt.  Also discussed possibly being tested for sleep apnea as this can cause elevated blood pressure.  She states that she does not typically snore or stop breathing at night per her husband.  Follow-up in 2 months.

## 2023-07-26 NOTE — Patient Instructions (Signed)
It was great to see you!  We are checking your labs today and will let you know the results via mychart/phone.   I am placing a referral to a therapist, they will call to schedule.   Start checking your blood pressure if able at home.   Let's follow-up in 2 months, sooner if you have concerns.  If a referral was placed today, you will be contacted for an appointment. Please note that routine referrals can sometimes take up to 3-4 weeks to process. Please call our office if you haven't heard anything after this time frame.  Take care,  Rodman Pickle, NP

## 2023-07-26 NOTE — Assessment & Plan Note (Signed)
BMI 57.6.  She states that she eats overall a healthy balanced diet and exercises 30 minutes most days sometimes more.  Most likely related to PCOS.

## 2023-07-26 NOTE — Assessment & Plan Note (Signed)
Chronic, not controlled.  She has been having an increase in mood swings and depression over the last several months.  She states that this summer it was hard to do things that she wanted to do or things that she enjoyed in the past.  She denies SI/HI.  She is interested in talking to a therapist, will make a referral today.  She declines medication at this point.  Follow-up in 2 months or sooner with concerns.

## 2023-07-26 NOTE — Assessment & Plan Note (Addendum)
Chronic, stable.  She states that she only gets asthma issues when she gets a respiratory infection.  She does not need an albuterol inhaler otherwise.  Follow-up with any concerns

## 2023-07-26 NOTE — Assessment & Plan Note (Signed)
Her last hemoglobin was 11.5.  Will recheck CBC and iron panel today.

## 2023-07-26 NOTE — Assessment & Plan Note (Signed)
Chronic, stable.  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  The pain was acting up a few months ago when she injured her left knee at work, however it is doing better now.  Follow-up with any concerns.

## 2023-07-26 NOTE — Progress Notes (Signed)
New Patient Visit  BP (!) 150/100 (BP Location: Left Wrist, Cuff Size: Large)   Pulse 97   Temp 97.8 F (36.6 C)   Ht 5\' 2"  (1.575 m)   Wt (!) 315 lb (142.9 kg)   SpO2 98%   BMI 57.61 kg/m    Subjective:    Patient ID: Lisa Prince, female    DOB: Apr 10, 1986, 37 y.o.   MRN: [id number]  CC: Chief Complaint  Patient presents with   Establish Care    NP. Est. Care, concerns with PCOS, request to get lab work, elevated B/P    HPI: Lisa Prince is a 38 y.o. female presents for new patient visit to establish care.  Introduced to Publishing rights manager role and practice setting.  All questions answered.  Discussed provider/patient relationship and expectations.  She has a history of PCOS.  She states that her menstrual periods periods are irregular and fluctuating. She states that they vary from being very heavy to skipping periods.  She had a large cyst that needed to be removed and ended up having her right ovary removed as well in January 2023.  Since then, she has been having mood swings, hot flashes, and fatigue. She is not currently seeing GYN.   She has a history of hypertension. Currently maintaining with garlic supplement, diet and exercise. She states that her blood pressure tends to fluctuate. She also is nervous at the doctor's offices. She has never taken medication for this. She has not been checking her blood pressure at home.  She denies chest pain and shortness of breath.  She has a history of mild intermittent asthma. She tends to only flare-up if she is sick and the cold air. She typically does not need any inhalers.  She denies shortness of breath and wheezing.  She feels like she has been depressed, even when she was younger, however it has worsened in the last few months.  She states that over the summer she has had little motivation of wanting to do things, even of things that she enjoys.  She has been having some crying spells.  She states that she will have a  hard time falling asleep but then will oversleep in the mornings.  She denies SI/HI.  She states that she does have some anxiety as well, however is mostly depression.  She has not seen anybody for this before.     07/26/2023   10:32 AM  Depression screen PHQ 2/9  Decreased Interest 2  Down, Depressed, Hopeless 2  PHQ - 2 Score 4  Altered sleeping 2  Tired, decreased energy 2  Change in appetite 1  Feeling bad or failure about yourself  1  Trouble concentrating 2  Moving slowly or fidgety/restless 1  Suicidal thoughts 1  PHQ-9 Score 14  Difficult doing work/chores Very difficult      07/26/2023   10:33 AM  GAD 7 : Generalized Anxiety Score  Nervous, Anxious, on Edge 2  Control/stop worrying 2  Worry too much - different things 1  Trouble relaxing 2  Restless 1  Easily annoyed or irritable 2  Afraid - awful might happen 1  Total GAD 7 Score 11  Anxiety Difficulty Somewhat difficult    Past Medical History:  Diagnosis Date   Allergy    Asthma    Chondromalacia    Hypertension    Not consistently high   PCOS (polycystic ovarian syndrome)     Past Surgical History:  Procedure Laterality Date  OOPHORECTOMY Right 12/2021   with cyst    Family History  Problem Relation Age of Onset   Asthma Mother    Varicose Veins Father    Diabetes Maternal Grandmother    Varicose Veins Paternal Grandmother    Varicose Veins Sister      Social History   Tobacco Use   Smoking status: Never   Smokeless tobacco: Never  Substance Use Topics   Alcohol use: Yes    Comment: occasional   Drug use: Never    Current Outpatient Medications on File Prior to Visit  Medication Sig Dispense Refill   Ascorbic Acid (VITAMIN C) 1000 MG tablet Take 2,000 mg by mouth daily.     Chromium Picolinate (CHROMIUM PICOLATE PO) Take by mouth daily.     No current facility-administered medications on file prior to visit.     Review of Systems  Constitutional:  Positive for fatigue.  Negative for fever.  HENT: Negative.    Eyes: Negative.   Respiratory: Negative.    Cardiovascular: Negative.   Gastrointestinal: Negative.   Genitourinary:  Positive for menstrual problem. Negative for dysuria and frequency.  Musculoskeletal:  Positive for arthralgias.  Skin: Negative.   Allergic/Immunologic: Positive for environmental allergies.  Neurological:  Positive for dizziness (intermittent). Negative for headaches.  Psychiatric/Behavioral:  Positive for dysphoric mood. The patient is nervous/anxious.       Objective:    BP (!) 150/100 (BP Location: Left Wrist, Cuff Size: Large)   Pulse 97   Temp 97.8 F (36.6 C)   Ht 5\' 2"  (1.575 m)   Wt (!) 315 lb (142.9 kg)   SpO2 98%   BMI 57.61 kg/m   Wt Readings from Last 3 Encounters:  07/26/23 (!) 315 lb (142.9 kg)  01/09/23 (!) 304 lb (137.9 kg)  08/23/22 (!) 304 lb 3.2 oz (138 kg)    BP Readings from Last 3 Encounters:  07/26/23 (!) 150/100  08/23/22 128/80  12/07/18 (!) 154/108    Physical Exam Vitals and nursing note reviewed.  Constitutional:      General: She is not in acute distress.    Appearance: Normal appearance. She is obese.  HENT:     Head: Normocephalic and atraumatic.     Right Ear: Tympanic membrane, ear canal and external ear normal.     Left Ear: Tympanic membrane, ear canal and external ear normal.  Eyes:     Conjunctiva/sclera: Conjunctivae normal.  Cardiovascular:     Rate and Rhythm: Normal rate and regular rhythm.     Pulses: Normal pulses.     Heart sounds: Normal heart sounds.  Pulmonary:     Effort: Pulmonary effort is normal.     Breath sounds: Normal breath sounds.  Abdominal:     Palpations: Abdomen is soft.     Tenderness: There is no abdominal tenderness.  Musculoskeletal:        General: Normal range of motion.     Cervical back: Normal range of motion and neck supple.     Right lower leg: No edema.     Left lower leg: No edema.  Lymphadenopathy:     Cervical: No cervical  adenopathy.  Skin:    General: Skin is warm and dry.  Neurological:     General: No focal deficit present.     Mental Status: She is alert and oriented to person, place, and time.     Cranial Nerves: No cranial nerve deficit.     Coordination: Coordination normal.  Gait: Gait normal.  Psychiatric:        Mood and Affect: Mood normal.        Behavior: Behavior normal.        Thought Content: Thought content normal.        Judgment: Judgment normal.       Assessment & Plan:   Problem List Items Addressed This Visit       Cardiovascular and Mediastinum   Hypertension    Chronic, stable.  Her blood pressure has been fluctuating over the years, however she states that she has not taken medication for this.  BP is slightly elevated today at 150/100.  Discussed checking blood pressure at home, limiting salt.  Also discussed possibly being tested for sleep apnea as this can cause elevated blood pressure.  She states that she does not typically snore or stop breathing at night per her husband.  Follow-up in 2 months.      Relevant Orders   CBC with Differential/Platelet   Comprehensive metabolic panel   Lipid panel     Respiratory   Asthma    Chronic, stable.  She states that she only gets asthma issues when she gets a respiratory infection.  She does not need an albuterol inhaler otherwise.  Follow-up with any concerns        Endocrine   PCOS (polycystic ovarian syndrome)    Chronic, not controlled.  She has been diagnosed with PCOS since she was in her 73s.  She states that her menstrual periods can be very irregular, heavy, and then sometimes she will skip months.  She ended up needing her right ovary removed along with the cyst last year.  Since then she has been having some hot flashes, mood swings, fatigue.  Will check FSH, LH, testosterone, TSH today.  Discussed starting birth control or metformin for her symptoms, she would like to hold off at this point.  Follow-up in 2  months.      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  The pain was acting up a few months ago when she injured her left knee at work, however it is doing better now.  Follow-up with any concerns.        Other   Anxiety and depression    Chronic, not controlled.  She has been having an increase in mood swings and depression over the last several months.  She states that this summer it was hard to do things that she wanted to do or things that she enjoyed in the past.  She denies SI/HI.  She is interested in talking to a therapist, will make a referral today.  She declines medication at this point.  Follow-up in 2 months or sooner with concerns.      Relevant Orders   TSH   VITAMIN D 25 Hydroxy (Vit-D Deficiency, Fractures)   Vitamin B12   Ambulatory referral to Psychology   Anemia    Her last hemoglobin was 11.5.  Will recheck CBC and iron panel today.      Relevant Orders   CBC with Differential/Platelet   Iron, TIBC and Ferritin Panel   Morbid obesity (HCC)    BMI 57.6.  She states that she eats overall a healthy balanced diet and exercises 30 minutes most days sometimes  more.  Most likely related to PCOS.      Relevant Orders   Hemoglobin A1c   VITAMIN D 25 Hydroxy (Vit-D Deficiency, Fractures)   Vitamin B12   Other Visit Diagnoses     Screening for HIV (human immunodeficiency virus)       Screen HIV today   Relevant Orders   HIV Antibody (routine testing w rflx)        Follow up plan: Return in about 2 months (around 09/25/2023) for CPE.  Aniesa Boback A Shley Dolby

## 2023-07-27 LAB — IRON,TIBC AND FERRITIN PANEL
%SAT: 16 % (ref 16–45)
Ferritin: 37 ng/mL (ref 16–154)
Iron: 54 ug/dL (ref 40–190)
TIBC: 332 ug/dL (ref 250–450)

## 2023-07-27 LAB — FSH/LH
FSH: 6.1 m[IU]/mL
LH: 8.3 m[IU]/mL

## 2023-07-27 LAB — HIV ANTIBODY (ROUTINE TESTING W REFLEX): HIV 1&2 Ab, 4th Generation: NONREACTIVE

## 2023-07-27 MED ORDER — VITAMIN D (ERGOCALCIFEROL) 1.25 MG (50000 UNIT) PO CAPS: 50000.0000 [IU] | ORAL_CAPSULE | ORAL | 1 refills | Status: DC

## 2023-07-27 NOTE — Addendum Note (Signed)
Addended by: Rodman Pickle A on: 07/27/2023 08:55 AM   Modules accepted: Orders

## 2023-07-28 ENCOUNTER — Encounter: Payer: Self-pay | Admitting: Nurse Practitioner

## 2023-09-27 ENCOUNTER — Ambulatory Visit: Payer: BC Managed Care – PPO | Admitting: Nurse Practitioner

## 2023-09-27 ENCOUNTER — Encounter: Payer: Self-pay | Admitting: Nurse Practitioner

## 2023-09-27 VITALS — BP 160/118 | HR 95 | Temp 97.0°F | Ht 62.0 in | Wt 315.6 lb

## 2023-09-27 DIAGNOSIS — F419 Anxiety disorder, unspecified: Secondary | ICD-10-CM | POA: Diagnosis not present

## 2023-09-27 DIAGNOSIS — F32A Depression, unspecified: Secondary | ICD-10-CM

## 2023-09-27 DIAGNOSIS — J4520 Mild intermittent asthma, uncomplicated: Secondary | ICD-10-CM | POA: Diagnosis not present

## 2023-09-27 DIAGNOSIS — I10 Essential (primary) hypertension: Secondary | ICD-10-CM

## 2023-09-27 DIAGNOSIS — Z0000 Encounter for general adult medical examination without abnormal findings: Secondary | ICD-10-CM | POA: Insufficient documentation

## 2023-09-27 DIAGNOSIS — R7303 Prediabetes: Secondary | ICD-10-CM | POA: Insufficient documentation

## 2023-09-27 MED ORDER — AMLODIPINE BESYLATE 5 MG PO TABS: 5.0000 mg | ORAL_TABLET | Freq: Every day | ORAL | 1 refills | Status: DC

## 2023-09-27 NOTE — Assessment & Plan Note (Signed)
Chronic, stable.  She states that she only gets asthma issues when she gets a respiratory infection.  She does not need an albuterol inhaler otherwise.  She declines pneumonia vaccine today.  Follow-up with any concerns

## 2023-09-27 NOTE — Patient Instructions (Signed)
It was great to see you!  Start amlodipine 1 tablet daily.   Start checking your blood pressure at home.   Let's follow-up in 4 weeks, sooner if you have concerns.  If a referral was placed today, you will be contacted for an appointment. Please note that routine referrals can sometimes take up to 3-4 weeks to process. Please call our office if you haven't heard anything after this time frame.  Take care,  Rodman Pickle, NP

## 2023-09-27 NOTE — Assessment & Plan Note (Signed)
Health maintenance reviewed and updated. Discussed nutrition, exercise.  Recent CMP, CBC reviewed.  Follow-up 1 year.

## 2023-09-27 NOTE — Assessment & Plan Note (Signed)
Most recent A1c was 6.5%.  Will need a second value to confirm diabetes.  Discussed nutrition, exercise.  Follow-up in 4 weeks and we will repeat A1c.

## 2023-09-27 NOTE — Assessment & Plan Note (Addendum)
Chronic, not controlled.  Her BP is elevated today at 160/118.  She has not been checking her blood sugars at home.  Will have her start amlodipine 5 mg daily.  Encouraged her to start checking her blood pressures at home.  Continue limiting salt and exercising regularly. Follow-up in 4 weeks.

## 2023-09-27 NOTE — Progress Notes (Signed)
BP (!) 160/118 (BP Location: Left Wrist, Cuff Size: Normal)   Pulse 95   Temp (!) 97 F (36.1 C)   Ht 5\' 2"  (1.575 m)   Wt (!) 315 lb 9.6 oz (143.2 kg)   SpO2 97%   BMI 57.72 kg/m    Subjective:    Patient ID: Lisa Prince, female    DOB: Aug 02, 1986, 37 y.o.   MRN: [id number]  CC: Chief Complaint  Patient presents with   Annual Exam    With non fasting lab work    HPI: Lisa Prince is a 37 y.o. female presenting on 09/27/2023 for comprehensive medical examination. Current medical complaints include:none  Depression and Anxiety Screen done today and results listed below:     09/27/2023   10:28 AM 07/26/2023   10:32 AM  Depression screen PHQ 2/9  Decreased Interest 1 2  Down, Depressed, Hopeless 1 2  PHQ - 2 Score 2 4  Altered sleeping 1 2  Tired, decreased energy 1 2  Change in appetite 1 1  Feeling bad or failure about yourself  1 1  Trouble concentrating 1 2  Moving slowly or fidgety/restless 0 1  Suicidal thoughts 1 1  PHQ-9 Score 8 14  Difficult doing work/chores Somewhat difficult Very difficult      09/27/2023   10:28 AM 07/26/2023   10:33 AM  GAD 7 : Generalized Anxiety Score  Nervous, Anxious, on Edge 1 2  Control/stop worrying 1 2  Worry too much - different things 1 1  Trouble relaxing 1 2  Restless 0 1  Easily annoyed or irritable 1 2  Afraid - awful might happen 0 1  Total GAD 7 Score 5 11  Anxiety Difficulty Not difficult at all Somewhat difficult    The patient does not have a history of falls. I did not complete a risk assessment for falls. A plan of care for falls was not documented.   Past Medical History:  Past Medical History:  Diagnosis Date   Allergy    Asthma    Chondromalacia    Hypertension    Not consistently high   PCOS (polycystic ovarian syndrome)     Surgical History:  Past Surgical History:  Procedure Laterality Date   OOPHORECTOMY Right 12/2021   with cyst    Medications:  Current Outpatient  Medications on File Prior to Visit  Medication Sig   Ascorbic Acid (VITAMIN C) 1000 MG tablet Take 2,000 mg by mouth daily.   Chromium Picolinate (CHROMIUM PICOLATE PO) Take by mouth daily.   Vitamin D, Ergocalciferol, (DRISDOL) 1.25 MG (50000 UNIT) CAPS capsule Take 1 capsule (50,000 Units total) by mouth every 7 (seven) days.   No current facility-administered medications on file prior to visit.    Allergies:  Allergies  Allergen Reactions   Other Other (See Comments)   Progesterone Shortness Of Breath, Itching and Rash    Itching, swelling, tightness in chest    Social History:  Social History   Socioeconomic History   Marital status: Married    Spouse name: Not on file   Number of children: Not on file   Years of education: Not on file   Highest education level: Not on file  Occupational History   Not on file  Tobacco Use   Smoking status: Never   Smokeless tobacco: Never  Substance and Sexual Activity   Alcohol use: Yes    Comment: occasional   Drug use: Never   Sexual activity: Yes  Birth control/protection: Coitus interruptus    Comment: Only one ovary  Other Topics Concern   Not on file  Social History Narrative   Not on file   Social Determinants of Health   Financial Resource Strain: Not on file  Food Insecurity: Not on file  Transportation Needs: Not on file  Physical Activity: Not on file  Stress: Not on file  Social Connections: Not on file  Intimate Partner Violence: Not on file   Social History   Tobacco Use  Smoking Status Never  Smokeless Tobacco Never   Social History   Substance and Sexual Activity  Alcohol Use Yes   Comment: occasional    Family History:  Family History  Problem Relation Age of Onset   Asthma Mother    Varicose Veins Father    Diabetes Maternal Grandmother    Varicose Veins Paternal Grandmother    Varicose Veins Sister     Past medical history, surgical history, medications, allergies, family history  and social history reviewed with patient today and changes made to appropriate areas of the chart.   Review of Systems  Constitutional: Negative.   HENT: Negative.    Eyes: Negative.   Respiratory: Negative.    Cardiovascular: Negative.   Gastrointestinal: Negative.   Genitourinary: Negative.   Musculoskeletal: Negative.   Skin: Negative.   Neurological: Negative.   Psychiatric/Behavioral: Negative.     All other ROS negative except what is listed above and in the HPI.      Objective:    BP (!) 160/118 (BP Location: Left Wrist, Cuff Size: Normal)   Pulse 95   Temp (!) 97 F (36.1 C)   Ht 5\' 2"  (1.575 m)   Wt (!) 315 lb 9.6 oz (143.2 kg)   SpO2 97%   BMI 57.72 kg/m   Wt Readings from Last 3 Encounters:  09/27/23 (!) 315 lb 9.6 oz (143.2 kg)  07/26/23 (!) 315 lb (142.9 kg)  01/09/23 (!) 304 lb (137.9 kg)    Physical Exam Vitals and nursing note reviewed.  Constitutional:      General: She is not in acute distress.    Appearance: Normal appearance. She is obese.  HENT:     Head: Normocephalic and atraumatic.     Right Ear: Tympanic membrane, ear canal and external ear normal.     Left Ear: Tympanic membrane, ear canal and external ear normal.  Eyes:     Conjunctiva/sclera: Conjunctivae normal.  Cardiovascular:     Rate and Rhythm: Normal rate and regular rhythm.     Pulses: Normal pulses.     Heart sounds: Normal heart sounds.  Pulmonary:     Effort: Pulmonary effort is normal.     Breath sounds: Normal breath sounds.  Abdominal:     Palpations: Abdomen is soft.     Tenderness: There is no abdominal tenderness.  Musculoskeletal:        General: Normal range of motion.     Cervical back: Normal range of motion and neck supple.     Right lower leg: No edema.     Left lower leg: No edema.  Lymphadenopathy:     Cervical: No cervical adenopathy.  Skin:    General: Skin is warm and dry.  Neurological:     General: No focal deficit present.     Mental Status:  She is alert and oriented to person, place, and time.     Cranial Nerves: No cranial nerve deficit.     Coordination:  Coordination normal.     Gait: Gait normal.  Psychiatric:        Mood and Affect: Mood normal.        Behavior: Behavior normal.        Thought Content: Thought content normal.        Judgment: Judgment normal.     Results for orders placed or performed in visit on 07/26/23  CBC with Differential/Platelet  Result Value Ref Range   WBC 10.6 (H) 4.0 - 10.5 K/uL   RBC 4.64 3.87 - 5.11 Mil/uL   Hemoglobin 12.1 12.0 - 15.0 g/dL   HCT 84.1 32.4 - 40.1 %   MCV 82.4 78.0 - 100.0 fl   MCHC 31.6 30.0 - 36.0 g/dL   RDW 02.7 25.3 - 66.4 %   Platelets 365.0 150.0 - 400.0 K/uL   Neutrophils Relative % 73.8 43.0 - 77.0 %   Lymphocytes Relative 17.5 12.0 - 46.0 %   Monocytes Relative 5.5 3.0 - 12.0 %   Eosinophils Relative 2.8 0.0 - 5.0 %   Basophils Relative 0.4 0.0 - 3.0 %   Neutro Abs 7.8 (H) 1.4 - 7.7 K/uL   Lymphs Abs 1.8 0.7 - 4.0 K/uL   Monocytes Absolute 0.6 0.1 - 1.0 K/uL   Eosinophils Absolute 0.3 0.0 - 0.7 K/uL   Basophils Absolute 0.0 0.0 - 0.1 K/uL  Comprehensive metabolic panel  Result Value Ref Range   Sodium 139 135 - 145 mEq/L   Potassium 4.3 3.5 - 5.1 mEq/L   Chloride 102 96 - 112 mEq/L   CO2 30 19 - 32 mEq/L   Glucose, Bld 85 70 - 99 mg/dL   BUN 13 6 - 23 mg/dL   Creatinine, Ser 4.03 0.40 - 1.20 mg/dL   Total Bilirubin 0.4 0.2 - 1.2 mg/dL   Alkaline Phosphatase 92 39 - 117 U/L   AST 20 0 - 37 U/L   ALT 29 0 - 35 U/L   Total Protein 7.3 6.0 - 8.3 g/dL   Albumin 4.0 3.5 - 5.2 g/dL   GFR 474.25 >95.63 mL/min   Calcium 9.0 8.4 - 10.5 mg/dL  Lipid panel  Result Value Ref Range   Cholesterol 163 0 - 200 mg/dL   Triglycerides 875.6 0.0 - 149.0 mg/dL   HDL 43.32 (L) >95.18 mg/dL   VLDL 84.1 0.0 - 66.0 mg/dL   LDL Cholesterol 97 0 - 99 mg/dL   Total CHOL/HDL Ratio 4    NonHDL 125.23   Hemoglobin A1c  Result Value Ref Range   Hgb A1c MFr Bld 6.5  4.6 - 6.5 %  TSH  Result Value Ref Range   TSH 2.64 0.35 - 5.50 uIU/mL  VITAMIN D 25 Hydroxy (Vit-D Deficiency, Fractures)  Result Value Ref Range   VITD 10.51 (L) 30.00 - 100.00 ng/mL  Vitamin B12  Result Value Ref Range   Vitamin B-12 240 211 - 911 pg/mL  HIV Antibody (routine testing w rflx)  Result Value Ref Range   HIV 1&2 Ab, 4th Generation NON-REACTIVE NON-REACTIVE  Iron, TIBC and Ferritin Panel  Result Value Ref Range   Iron 54 40 - 190 mcg/dL   TIBC 630 160 - 109 mcg/dL (calc)   %SAT 16 16 - 45 % (calc)   Ferritin 37 16 - 154 ng/mL  FSH/LH  Result Value Ref Range   FSH 6.1 mIU/mL   LH 8.3 mIU/mL  Testosterone  Result Value Ref Range   Testosterone 46.22 (H) 15.00 - 40.00 ng/dL  HM PAP SMEAR  Result Value Ref Range   HM Pap smear normal       Assessment & Plan:   Problem List Items Addressed This Visit       Cardiovascular and Mediastinum   Hypertension    Chronic, not controlled.  Her BP is elevated today at 160/118.  She has not been checking her blood sugars at home.  Will have her start amlodipine 5 mg daily.  Encouraged her to start checking her blood pressures at home.  Continue limiting salt and exercising regularly. Follow-up in 4 weeks.       Relevant Medications   amLODipine (NORVASC) 5 MG tablet     Respiratory   Asthma    Chronic, stable.  She states that she only gets asthma issues when she gets a respiratory infection.  She does not need an albuterol inhaler otherwise.  She declines pneumonia vaccine today.  Follow-up with any concerns        Other   Anxiety and depression    Chronic, ongoing.  She is still trying to get in touch with the therapist and find a time that she can schedule appointments with her work schedule.  Encouraged her to reach out.      Morbid obesity (HCC)    BMI 57.7.  She exercises regularly.      Routine general medical examination at a health care facility - Primary    Health maintenance reviewed and updated.  Discussed nutrition, exercise. Recent CMP, CBC reviewed. Follow-up 1 year.        Prediabetes    Most recent A1c was 6.5%.  Will need a second value to confirm diabetes.  Discussed nutrition, exercise.  Follow-up in 4 weeks and we will repeat A1c.        Follow up plan: Return in about 4 weeks (around 10/25/2023) for HTN.   LABORATORY TESTING:  - Pap smear: up to date  IMMUNIZATIONS:   - Tdap: Tetanus vaccination status reviewed: last tetanus booster within 10 years. - Influenza:  declined today - Pneumovax: Not applicable - Prevnar:  declined today - HPV: Not applicable - Shingrix vaccine: Not applicable  SCREENING: -Mammogram: Not applicable  - Colonoscopy: Not applicable  - Bone Density: Not applicable   PATIENT COUNSELING:   Advised to take 1 mg of folate supplement per day if capable of pregnancy.   Sexuality: Discussed sexually transmitted diseases, partner selection, use of condoms, avoidance of unintended pregnancy  and contraceptive alternatives.   Advised to avoid cigarette smoking.  I discussed with the patient that most people either abstain from alcohol or drink within safe limits (<=14/week and <=4 drinks/occasion for males, <=7/weeks and <= 3 drinks/occasion for females) and that the risk for alcohol disorders and other health effects rises proportionally with the number of drinks per week and how often a drinker exceeds daily limits.  Discussed cessation/primary prevention of drug use and availability of treatment for abuse.   Diet: Encouraged to adjust caloric intake to maintain  or achieve ideal body weight, to reduce intake of dietary saturated fat and total fat, to limit sodium intake by avoiding high sodium foods and not adding table salt, and to maintain adequate dietary potassium and calcium preferably from fresh fruits, vegetables, and low-fat dairy products.    stressed the importance of regular exercise  Injury prevention: Discussed safety belts,  safety helmets, smoke detector, smoking near bedding or upholstery.   Dental health: Discussed importance of regular tooth brushing, flossing, and  dental visits.    NEXT PREVENTATIVE PHYSICAL DUE IN 1 YEAR. Return in about 4 weeks (around 10/25/2023) for HTN.  Rayya Yagi A Yoni Lobos

## 2023-09-27 NOTE — Assessment & Plan Note (Signed)
Chronic, ongoing.  She is still trying to get in touch with the therapist and find a time that she can schedule appointments with her work schedule.  Encouraged her to reach out.

## 2023-09-27 NOTE — Assessment & Plan Note (Signed)
BMI 57.7.  She exercises regularly.

## 2023-10-30 ENCOUNTER — Ambulatory Visit: Payer: BC Managed Care – PPO | Admitting: Nurse Practitioner

## 2023-11-04 ENCOUNTER — Other Ambulatory Visit: Payer: Self-pay

## 2023-11-04 ENCOUNTER — Emergency Department (HOSPITAL_COMMUNITY): Payer: BC Managed Care – PPO

## 2023-11-04 ENCOUNTER — Encounter (HOSPITAL_COMMUNITY): Payer: Self-pay

## 2023-11-04 ENCOUNTER — Emergency Department (HOSPITAL_COMMUNITY)
Admission: EM | Admit: 2023-11-04 | Discharge: 2023-11-04 | Disposition: A | Discharge status: Home / Self Care | Payer: BC Managed Care – PPO | Attending: Emergency Medicine | Admitting: Emergency Medicine

## 2023-11-04 DIAGNOSIS — R1012 Left upper quadrant pain: Secondary | ICD-10-CM | POA: Insufficient documentation

## 2023-11-04 DIAGNOSIS — R140 Abdominal distension (gaseous): Secondary | ICD-10-CM | POA: Insufficient documentation

## 2023-11-04 DIAGNOSIS — R109 Unspecified abdominal pain: Secondary | ICD-10-CM

## 2023-11-04 DIAGNOSIS — R110 Nausea: Secondary | ICD-10-CM | POA: Diagnosis not present

## 2023-11-04 LAB — URINALYSIS, ROUTINE W REFLEX MICROSCOPIC
Bacteria, UA: NONE SEEN
Bilirubin Urine: NEGATIVE
Glucose, UA: NEGATIVE mg/dL
Ketones, ur: NEGATIVE mg/dL
Nitrite: NEGATIVE
Protein, ur: 30 mg/dL — AB
Specific Gravity, Urine: 1.026 (ref 1.005–1.030)
pH: 5 (ref 5.0–8.0)

## 2023-11-04 LAB — COMPREHENSIVE METABOLIC PANEL
ALT: 33 U/L (ref 0–44)
AST: 21 U/L (ref 15–41)
Albumin: 3.9 g/dL (ref 3.5–5.0)
Alkaline Phosphatase: 84 U/L (ref 38–126)
Anion gap: 8 (ref 5–15)
BUN: 14 mg/dL (ref 6–20)
CO2: 24 mmol/L (ref 22–32)
Calcium: 8.3 mg/dL — ABNORMAL LOW (ref 8.9–10.3)
Chloride: 102 mmol/L (ref 98–111)
Creatinine, Ser: 0.7 mg/dL (ref 0.44–1.00)
GFR, Estimated: >60 mL/min (ref >60)
Glucose, Bld: 101 mg/dL — ABNORMAL HIGH (ref 70–99)
Potassium: 3.6 mmol/L (ref 3.5–5.1)
Sodium: 134 mmol/L — ABNORMAL LOW (ref 135–145)
Total Bilirubin: 0.6 mg/dL (ref <1.2)
Total Protein: 8.3 g/dL — ABNORMAL HIGH (ref 6.5–8.1)

## 2023-11-04 LAB — LIPASE, BLOOD: Lipase: 23 U/L (ref 11–51)

## 2023-11-04 LAB — CBC WITH DIFFERENTIAL/PLATELET
Abs Immature Granulocytes: 0.07 10*3/uL (ref 0.00–0.07)
Basophils Absolute: 0 10*3/uL (ref 0.0–0.1)
Basophils Relative: 0 %
Eosinophils Absolute: 0.1 10*3/uL (ref 0.0–0.5)
Eosinophils Relative: 1 %
HCT: 43.5 % (ref 36.0–46.0)
Hemoglobin: 13.5 g/dL (ref 12.0–15.0)
Immature Granulocytes: 1 %
Lymphocytes Relative: 6 %
Lymphs Abs: 0.7 10*3/uL (ref 0.7–4.0)
MCH: 26.3 pg (ref 26.0–34.0)
MCHC: 31 g/dL (ref 30.0–36.0)
MCV: 84.8 fL (ref 80.0–100.0)
Monocytes Absolute: 0.4 10*3/uL (ref 0.1–1.0)
Monocytes Relative: 3 %
Neutro Abs: 9.9 10*3/uL — ABNORMAL HIGH (ref 1.7–7.7)
Neutrophils Relative %: 89 %
Platelets: 334 10*3/uL (ref 150–400)
RBC: 5.13 MIL/uL — ABNORMAL HIGH (ref 3.87–5.11)
RDW: 14 % (ref 11.5–15.5)
WBC: 11.1 10*3/uL — ABNORMAL HIGH (ref 4.0–10.5)
nRBC: 0 % (ref 0.0–0.2)

## 2023-11-04 LAB — HCG, SERUM, QUALITATIVE: Preg, Serum: NEGATIVE

## 2023-11-04 LAB — PREGNANCY, URINE: Preg Test, Ur: NEGATIVE

## 2023-11-04 MED ORDER — ONDANSETRON HCL 4 MG PO TABS: 4.0000 mg | ORAL_TABLET | Freq: Four times a day (QID) | ORAL | 0 refills | Status: DC

## 2023-11-04 MED ORDER — SODIUM CHLORIDE 0.9 % IV BOLUS
1000.0000 mL | Freq: Once | INTRAVENOUS | Status: AC
  Administered 2023-11-04: 1000 mL via INTRAVENOUS

## 2023-11-04 MED ORDER — KETOROLAC TROMETHAMINE 30 MG/ML IJ SOLN
30.0000 mg | Freq: Once | INTRAMUSCULAR | Status: AC
  Administered 2023-11-04: 30 mg via INTRAVENOUS
  Filled 2023-11-04: qty 1

## 2023-11-04 MED ORDER — MORPHINE SULFATE (PF) 4 MG/ML IV SOLN
4.0000 mg | Freq: Once | INTRAVENOUS | Status: AC
  Administered 2023-11-04: 4 mg via INTRAVENOUS
  Filled 2023-11-04: qty 1

## 2023-11-04 MED ORDER — ONDANSETRON HCL 4 MG/2ML IJ SOLN
4.0000 mg | Freq: Once | INTRAMUSCULAR | Status: AC
  Administered 2023-11-04: 4 mg via INTRAVENOUS
  Filled 2023-11-04: qty 2

## 2023-11-04 MED ORDER — IOHEXOL 300 MG/ML  SOLN
100.0000 mL | Freq: Once | INTRAMUSCULAR | Status: AC | PRN
  Administered 2023-11-04: 100 mL via INTRAVENOUS

## 2023-11-04 MED ORDER — ACETAMINOPHEN 500 MG PO TABS: 1000.0000 mg | ORAL_TABLET | Freq: Once | ORAL | Status: DC

## 2023-11-04 NOTE — ED Provider Notes (Signed)
EMERGENCY DEPARTMENT AT [facility] Provider Note   CSN: [id number] Arrival date & time: 11/04/23  1448     History  Chief Complaint  Patient presents with   Abdominal Pain    Lisa Prince is a 37 y.o. female.  HPI   37 year old female presents emergency department with left upper quadrant pain that radiates to her entire abdomen.  Patient has felt nauseous and bloated for 2 days.  She has never had any vomiting or diarrhea but feels like the pain is worsening.  She had subjective fever/chills overnight.  Pain does not radiate around to her back.  No history of gallstones/kidney stones.  Denies any dysuria, hematuria, vaginal bleeding/discharge.  She has had a right oophorectomy secondary to a dermoid cyst.  Home Medications Prior to Admission medications   Medication Sig Start Date End Date Taking? Authorizing Provider  amLODipine (NORVASC) 5 MG tablet Take 1 tablet (5 mg total) by mouth daily. 09/27/23   McElwee, Lauren A, NP  Ascorbic Acid (VITAMIN C) 1000 MG tablet Take 2,000 mg by mouth daily.    [provider]  Chromium Picolinate (CHROMIUM PICOLATE PO) Take by mouth daily.    [provider]  Vitamin D, Ergocalciferol, (DRISDOL) 1.25 MG (50000 UNIT) CAPS capsule Take 1 capsule (50,000 Units total) by mouth every 7 (seven) days. 07/27/23   McElwee, Jake Church, NP      Allergies    Other and Progesterone    Review of Systems   Review of Systems  Constitutional:  Positive for chills, fatigue and fever.  Respiratory:  Negative for shortness of breath.   Cardiovascular:  Negative for chest pain.  Gastrointestinal:  Positive for abdominal pain and nausea. Negative for diarrhea and vomiting.  Genitourinary:  Negative for dysuria, flank pain, hematuria, vaginal bleeding and vaginal discharge.  Skin:  Negative for rash.  Neurological:  Negative for headaches.    Physical Exam Updated Vital Signs BP (!) 190/110 (BP Location: Left  Arm)   Pulse (!) 116   Temp (!) 100.5 F (38.1 C) (Oral)   Resp 16   Ht 5\' 2"  (1.575 m)   Wt (!) 143.2 kg   SpO2 100%   BMI 57.74 kg/m  Physical Exam Vitals and nursing note reviewed.  Constitutional:      General: She is not in acute distress.    Appearance: Normal appearance.  HENT:     Head: Normocephalic.     Mouth/Throat:     Mouth: Mucous membranes are moist.  Cardiovascular:     Rate and Rhythm: Normal rate.  Pulmonary:     Effort: Pulmonary effort is normal. No respiratory distress.  Abdominal:     General: Bowel sounds are normal.     Palpations: Abdomen is soft.     Tenderness: There is generalized abdominal tenderness and tenderness in the left upper quadrant. There is no guarding or rebound.  Skin:    General: Skin is warm.  Neurological:     Mental Status: She is alert and oriented to person, place, and time. Mental status is at baseline.  Psychiatric:        Mood and Affect: Mood normal.     ED Results / Procedures / Treatments   Labs (all labs ordered are listed, but only abnormal results are displayed) Labs Reviewed  CBC WITH DIFFERENTIAL/PLATELET  COMPREHENSIVE METABOLIC PANEL  LIPASE, BLOOD  HCG, SERUM, QUALITATIVE  URINALYSIS, ROUTINE W REFLEX MICROSCOPIC    EKG None  Radiology  No results found.  Procedures .Critical Care  Performed by: Rozelle Logan, DO Authorized by: Rozelle Logan, DO   Critical care provider statement:    Critical care time (minutes):  30   Critical care time was exclusive of:  Separately billable procedures and treating other patients   Critical care was necessary to treat or prevent imminent or life-threatening deterioration of the following conditions:  Dehydration   Critical care was time spent personally by me on the following activities:  Development of treatment plan with patient or surrogate, discussions with consultants, evaluation of patient's response to treatment, examination of patient, ordering  and review of laboratory studies, ordering and review of radiographic studies, ordering and performing treatments and interventions, pulse oximetry, re-evaluation of patient's condition and review of old charts   I assumed direction of critical care for this patient from another provider in my specialty: no       Medications Ordered in ED Medications  acetaminophen (TYLENOL) tablet 1,000 mg (has no administration in time range)    ED Course/ Medical Decision Making/ A&P                                 Medical Decision Making Amount and/or Complexity of Data Reviewed Labs: ordered. Radiology: ordered.  Risk OTC drugs. Prescription drug management.   37 year old female presents emergency department with left upper quadrant abdominal pain, bloating, nausea.  She is febrile, tachycardic on arrival, initially hypertensive.  She appears unwell but not acutely ill.  Abdomen is soft.  Blood work shows a mild leukocytosis, chemistry and abdominal labs are normal.  Urinalysis is unremarkable, lipase is normal and pregnancy test is negative.  CT of the abdomen pelvis shows no acute finding.  After IV medicine patient is feeling improved, she is been able to p.o.  Most likely a viral process.  Will send her home with symptomatic medication.  Patient at this time appears safe and stable for discharge and close outpatient follow up. Discharge plan and strict return to ED precautions discussed, patient verbalizes understanding and agreement.        Final Clinical Impression(s) / ED Diagnoses Final diagnoses:  None    Rx / DC Orders ED Discharge Orders     None         Rozelle Logan, DO 11/04/23 2210

## 2023-11-04 NOTE — Discharge Instructions (Signed)
You have been seen and discharged from the emergency department.  Your blood work was normal outside of dehydration.  The CAT scan of your abdomen showed no acute finding.  I believe you are suffering from a viral stomach flu.  Stable hydrated, take nausea medicine as needed.  Use Tylenol/ibuprofen as needed for pain control.  Follow-up with your primary provider for further evaluation and further care. Take home medications as prescribed. If you have any worsening symptoms or further concerns for your health please return to an emergency department for further evaluation.

## 2023-11-04 NOTE — ED Triage Notes (Signed)
Patient reports LUQ pain and nausea x 2 days. Denies diarrhea and vomiting. Patient took gas-x without relief. Patient has fever on arrival.

## 2023-11-06 ENCOUNTER — Telehealth: Payer: Self-pay

## 2023-11-06 NOTE — Transitions of Care (Post Inpatient/ED Visit) (Signed)
   11/06/2023  Name: Lisa Prince MRN: [id number] DOB: 1986-08-30  Today's TOC FU Call Status: Today's TOC FU Call Status:: Unsuccessful Call (1st Attempt) Unsuccessful Call (1st Attempt) Date: 11/06/23  Attempted to reach the patient regarding the most recent Inpatient/ED visit.  Follow Up Plan: Additional outreach attempts will be made to reach the patient to complete the Transitions of Care (Post Inpatient/ED visit) call.   Signature Arvil Persons, BSN, Charity fundraiser

## 2023-11-16 NOTE — Transitions of Care (Post Inpatient/ED Visit) (Signed)
   11/16/2023  Name: Lisa Prince MRN: [id number] DOB: [birth year]  Today's TOC FU Call Status: Today's TOC FU Call Status:: Unsuccessful Call (2nd Attempt) Unsuccessful Call (1st Attempt) Date: 11/06/23 Unsuccessful Call (2nd Attempt) Date: 11/16/23  Attempted to reach the patient regarding the most recent Inpatient/ED visit.  Follow Up Plan: Additional outreach attempts will be made to reach the patient to complete the Transitions of Care (Post Inpatient/ED visit) call.   Signature Arvil Persons, BSN, Charity fundraiser

## 2023-11-22 NOTE — Transitions of Care (Post Inpatient/ED Visit) (Signed)
   11/22/2023  Name: Lisa Prince MRN: [id number] DOB: 1986-05-30  Today's TOC FU Call Status: Today's TOC FU Call Status:: Unsuccessful Call (3rd Attempt) Unsuccessful Call (1st Attempt) Date: 11/06/23 Unsuccessful Call (2nd Attempt) Date: 11/16/23 Unsuccessful Call (3rd Attempt) Date: 11/22/23  Attempted to reach the patient regarding the most recent Inpatient/ED visit.  Follow Up Plan: No further outreach attempts will be made at this time. We have been unable to contact the patient.  Signature Arvil Persons, BSN, Charity fundraiser

## 2023-11-25 ENCOUNTER — Other Ambulatory Visit: Payer: Self-pay | Admitting: Nurse Practitioner

## 2023-11-28 NOTE — Telephone Encounter (Signed)
Requesting: AMLODIPINE BESYLATE 5 MG TAB ,VITAMIN D2 1.25MG (50,000 UNIT)  Last Visit: 09/27/2023 Next Visit: Visit date not found Last Refill: 09/27/2023, 07/27/2023   Please Advise

## 2024-01-19 ENCOUNTER — Other Ambulatory Visit: Payer: Self-pay | Admitting: Nurse Practitioner

## 2024-01-19 NOTE — Telephone Encounter (Signed)
 Requesting: AMLODIPINE  BESYLATE 5 MG TAB  Last Visit: 09/27/2023 Next Visit: Visit date not found Last Refill: 11/28/2023  Please Advise   Patient needs a follow up visit.     I called patient and left a detailed voicemail and will also send a Mychart message to schedule an appointment.

## 2024-02-02 NOTE — Telephone Encounter (Signed)
I received a notification of unread Mychart message sent to patient. I called patient and LVM to return call.

## 2024-03-13 ENCOUNTER — Ambulatory Visit: Admitting: Nurse Practitioner

## 2024-03-13 ENCOUNTER — Encounter: Payer: Self-pay | Admitting: Nurse Practitioner

## 2024-03-13 VITALS — BP 180/110 | HR 87 | Temp 97.3°F | Ht 62.0 in | Wt 313.8 lb

## 2024-03-13 DIAGNOSIS — J3489 Other specified disorders of nose and nasal sinuses: Secondary | ICD-10-CM | POA: Insufficient documentation

## 2024-03-13 DIAGNOSIS — R7303 Prediabetes: Secondary | ICD-10-CM

## 2024-03-13 DIAGNOSIS — R229 Localized swelling, mass and lump, unspecified: Secondary | ICD-10-CM | POA: Diagnosis not present

## 2024-03-13 DIAGNOSIS — I10 Essential (primary) hypertension: Secondary | ICD-10-CM | POA: Diagnosis not present

## 2024-03-13 LAB — POCT GLYCOSYLATED HEMOGLOBIN (HGB A1C)
HbA1c POC (<> result, manual entry): 6.2 % (ref 4.0–5.6)
HbA1c, POC (controlled diabetic range): 6.2 % (ref 0.0–7.0)
HbA1c, POC (prediabetic range): 6.2 % (ref 5.7–6.4)
Hemoglobin A1C: 6.2 % — AB (ref 4.0–5.6)

## 2024-03-13 MED ORDER — AMLODIPINE BESYLATE 5 MG PO TABS: 5.0000 mg | ORAL_TABLET | Freq: Every day | ORAL | 3 refills | Status: AC

## 2024-03-13 NOTE — Assessment & Plan Note (Signed)
 Chronic, stable. Her A1c level is 6.2%, indicating prediabetes. Improvement noted with dietary changes. Continue dietary management and monitor A1c levels regularly.

## 2024-03-13 NOTE — Assessment & Plan Note (Signed)
 Chronic, not controlled. Her hypertension is uncontrolled with a current reading of 180/110 mmHg. She was previously managed with amlodipine but has been off medication for a month, leading to symptom recurrence. Refill amlodipine 5mg  prescription and take one daily. Advise regular home blood pressure monitoring. Schedule follow-up in six months or sooner if symptoms persist.

## 2024-03-13 NOTE — Patient Instructions (Signed)
 It was great to see you!  I have refilled your amlodipine to take once a day  Keep checking your blood pressure at home.   Start flonase nasal spray daily   Let's follow-up in 6 months, sooner if you have concerns.  If a referral was placed today, you will be contacted for an appointment. Please note that routine referrals can sometimes take up to 3-4 weeks to process. Please call our office if you haven't heard anything after this time frame.  Take care,  Rodman Pickle, NP

## 2024-03-13 NOTE — Progress Notes (Signed)
 Established Patient Office Visit  Subjective   Patient ID: Lisa Prince, female    DOB: Mar 31, 1986  Age: 38 y.o. MRN: [id number]  Chief Complaint  Patient presents with   Hypertension    Follow up, concerns with reoccurring left facial sinus issues and that radiates to neck    HPI Discussed the use of AI scribe software for clinical note transcription with the patient, who gave verbal consent to proceed.  History of Present Illness   The patient, with a history of Polycystic Ovary Syndrome (PCOS) and hypertension, presents with concerns about elevated blood pressure and recurring sinus pain. The patient reports having been busy and stressed recently, which she believes may be contributing to her symptoms. She has been monitoring her blood pressure at home, but reports inconsistent readings. She also mentions experiencing occasional headaches, which she attributes to stress and high blood pressure. She has been out of her amlodipine for the last month.   In addition to the blood pressure concerns, the patient reports recurring sinus pain, particularly on the left side. She describes the pain as a feeling of congestion and notes that it often extends down into her neck. She also reports a swollen lymph node in the same area. The patient states that these symptoms occur multiple times a year and have been a cause of concern. She also has some swelling on the left side of her neck.        ROS See pertinent positives and negatives per HPI.    Objective:     BP (!) 180/110 (BP Location: Left Wrist, Cuff Size: Large)   Pulse 87   Temp (!) 97.3 F (36.3 C)   Ht 5\' 2"  (1.575 m)   Wt (!) 313 lb 12.8 oz (142.3 kg)   SpO2 97%   BMI 57.39 kg/m  BP Readings from Last 3 Encounters:  03/13/24 (!) 180/110  11/04/23 (!) 148/89  09/27/23 (!) 160/118   Wt Readings from Last 3 Encounters:  03/13/24 (!) 313 lb 12.8 oz (142.3 kg)  11/04/23 (!) 315 lb 11.2 oz (143.2 kg)  09/27/23 (!)  315 lb 9.6 oz (143.2 kg)      Physical Exam Vitals and nursing note reviewed.  Constitutional:      General: She is not in acute distress.    Appearance: Normal appearance.  HENT:     Head: Normocephalic.     Right Ear: Tympanic membrane, ear canal and external ear normal.     Left Ear: Tympanic membrane, ear canal and external ear normal.     Nose:     Right Sinus: Frontal sinus tenderness present. No maxillary sinus tenderness.     Left Sinus: Frontal sinus tenderness present. No maxillary sinus tenderness.     Mouth/Throat:     Mouth: Mucous membranes are moist.     Pharynx: No posterior oropharyngeal erythema.  Eyes:     Conjunctiva/sclera: Conjunctivae normal.  Cardiovascular:     Rate and Rhythm: Normal rate and regular rhythm.     Pulses: Normal pulses.     Heart sounds: Normal heart sounds.  Pulmonary:     Effort: Pulmonary effort is normal.     Breath sounds: Normal breath sounds.  Musculoskeletal:     Cervical back: Normal range of motion and neck supple. Tenderness (left side) present.  Lymphadenopathy:     Cervical: No cervical adenopathy.  Skin:    General: Skin is warm.     Comments: Soft tissue swelling at the  base of left side of neck  Neurological:     General: No focal deficit present.     Mental Status: She is alert and oriented to person, place, and time.  Psychiatric:        Mood and Affect: Mood normal.        Behavior: Behavior normal.        Thought Content: Thought content normal.        Judgment: Judgment normal.      Assessment & Plan:   Problem List Items Addressed This Visit       Cardiovascular and Mediastinum   Hypertension - Primary   Chronic, not controlled. Her hypertension is uncontrolled with a current reading of 180/110 mmHg. She was previously managed with amlodipine but has been off medication for a month, leading to symptom recurrence. Refill amlodipine 5mg  prescription and take one daily. Advise regular home blood pressure  monitoring. Schedule follow-up in six months or sooner if symptoms persist.      Relevant Medications   amLODipine (NORVASC) 5 MG tablet     Other   Prediabetes   Chronic, stable. Her A1c level is 6.2%, indicating prediabetes. Improvement noted with dietary changes. Continue dietary management and monitor A1c levels regularly.       Relevant Orders   POCT glycosylated hemoglobin (Hb A1C) (Completed)   Sinus pain   She experiences recurrent left-sided sinus pain with ear congestion and swollen lymph nodes, suggestive of chronic sinusitis or allergic rhinitis. Start Flonase nasal spray daily. Advise reporting if symptoms do not improve; consider imaging if no improvement. Recommend warm compresses for swollen lymph nodes.      Other Visit Diagnoses       Soft tissue swelling       Noted at base of left lateral neck. Will order ultrasound for further evaluation.   Relevant Orders   US Soft Tissue Head/Neck (NON-THYROID)       Return in about 6 months (around 09/12/2024) for CPE.    Gerre Scull, NP

## 2024-03-13 NOTE — Assessment & Plan Note (Signed)
 She experiences recurrent left-sided sinus pain with ear congestion and swollen lymph nodes, suggestive of chronic sinusitis or allergic rhinitis. Start Flonase nasal spray daily. Advise reporting if symptoms do not improve; consider imaging if no improvement. Recommend warm compresses for swollen lymph nodes.

## 2024-03-22 ENCOUNTER — Ambulatory Visit
Admission: RE | Admit: 2024-03-22 | Discharge: 2024-03-22 | Disposition: A | Discharge status: Home / Self Care | Source: Ambulatory Visit | Attending: Nurse Practitioner | Admitting: Nurse Practitioner

## 2024-03-22 DIAGNOSIS — R229 Localized swelling, mass and lump, unspecified: Secondary | ICD-10-CM

## 2024-03-25 ENCOUNTER — Encounter: Payer: Self-pay | Admitting: Nurse Practitioner

## 2024-03-25 DIAGNOSIS — I10 Essential (primary) hypertension: Secondary | ICD-10-CM

## 2024-03-25 DIAGNOSIS — R809 Proteinuria, unspecified: Secondary | ICD-10-CM

## 2024-03-25 MED ORDER — AMOXICILLIN-POT CLAVULANATE 875-125 MG PO TABS: 1.0000 | ORAL_TABLET | Freq: Two times a day (BID) | ORAL | 0 refills | Status: DC

## 2024-03-25 NOTE — Telephone Encounter (Signed)
 I called patient and left a message to call the office back and schedule a lab visit for 3-4 weeks.

## 2024-10-21 ENCOUNTER — Encounter: Payer: Self-pay | Admitting: Medical

## 2024-10-21 ENCOUNTER — Other Ambulatory Visit: Payer: Self-pay

## 2024-10-21 ENCOUNTER — Ambulatory Visit: Payer: Self-pay | Admitting: Medical

## 2024-10-21 VITALS — BP 140/100 | HR 92 | Temp 99.2°F | Ht 62.0 in | Wt 300.0 lb

## 2024-10-21 DIAGNOSIS — J3489 Other specified disorders of nose and nasal sinuses: Secondary | ICD-10-CM

## 2024-10-21 DIAGNOSIS — J4521 Mild intermittent asthma with (acute) exacerbation: Secondary | ICD-10-CM

## 2024-10-21 DIAGNOSIS — J069 Acute upper respiratory infection, unspecified: Secondary | ICD-10-CM

## 2024-10-21 MED ORDER — AMOXICILLIN-POT CLAVULANATE 875-125 MG PO TABS: 1.0000 | ORAL_TABLET | Freq: Two times a day (BID) | ORAL | 0 refills | Status: AC

## 2024-10-21 MED ORDER — ALBUTEROL SULFATE HFA 108 (90 BASE) MCG/ACT IN AERS: 1.0000 | INHALATION_SPRAY | RESPIRATORY_TRACT | 0 refills | Status: AC | PRN

## 2024-10-21 NOTE — Progress Notes (Signed)
 [facility] [address] [place], [place] [address]   Office Visit Note  Patient Name: Lisa Prince Date of Birth [id number]  Medical Record number [id number]  Date of Service: 10/21/2024  Chief Complaint  Patient presents with   Acute Visit    Patient states she has nasal congestion that she feels is settling into her chest. Cough is minimally productive and she feels like there is inflammation in the left side of her chest. She hears wheezing when inhaling. Symptoms have been persistent over the past week. She has been taking Coricidin which has been slightly helpful. She was unsure whether she can take Sudafed d/t having hypertension.     HPI Discussed the use of AI scribe software for clinical note transcription with the patient, who gave verbal consent to proceed.  History of Present Illness Lisa Prince is a 38 year old female with asthma who presents with worsening upper respiratory symptoms.  Upper respiratory symptoms - Onset Tuesday or Wednesday of last week, 5-6 days ago - Initial symptoms included sinus pressure and headache, primarily on the left side of the face; HA initially worsened, also developed nasal/sinus congestion. - By 2 days ago, partial improvement but not full recovery - Significant worsening after attending all-day rugby event outdoors with increased congestion in head and chest - No fever, but temperature fluctuations early in illness - Congestion has settled into chest - Persistent non-productive cough causing chest discomfort - Mucus from nose occasionally light green in color - Difficulty sleeping due to coughing, averaging four hours of sleep recently - No body or muscle aches  Asthma and respiratory reactivity - History of asthma, typically cold temperature-induced - Sensitive to dust and indoor allergens - No current use of inhaler; has not required one for three to four years - Wheezing and sensation of chest  tightness, especially when taking deep breaths - No history of pneumonia - No nebulizer use since childhood  Sinus and ear pressure - History of sinus issues but no recent sinus infections - Pressure in ears, particularly left ear, which feels clogged  Medication use and management - Using Coricidin product containing acetaminophen  and an expectorant for the last two days, with some relief - Has used Sudafed in past but trying to avoids given high blood pressure  Lifestyle factors - No smoking or vaping - 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   albuterol (VENTOLIN HFA) 108 (90 Base) MCG/ACT inhaler Inhale 1-2 puffs into the lungs every 4 (four) hours as needed for wheezing or shortness of breath (or cough).   amLODipine  (NORVASC ) 5 MG tablet Take 1 tablet (5 mg total) by mouth daily.   amoxicillin -clavulanate (AUGMENTIN ) 875-125 MG tablet Take 1 tablet by mouth 2 (two) times daily for 10 days.   Ascorbic Acid (VITAMIN C) 1000 MG tablet Take 2,000 mg by mouth daily.   Chromium Picolinate (CHROMIUM PICOLATE PO) Take 1 capsule by mouth daily.   GAS-X EXTRA STRENGTH 125 MG chewable tablet Chew 125 mg by mouth every 6 (six) hours as needed for flatulence.   [DISCONTINUED] amoxicillin -clavulanate (AUGMENTIN ) 875-125 MG tablet Take 1 tablet by mouth 2 (two) times  daily.   [DISCONTINUED] ondansetron  (ZOFRAN ) 4 MG tablet Take 1 tablet (4 mg total) by mouth every 6 (six) hours. (Patient not taking: Reported on 03/13/2024)   No facility-administered encounter medications on file as of 10/21/2024.      Medical History: Past Medical History:  Diagnosis Date   Allergy     Asthma    Chondromalacia    Hypertension    Not consistently high   PCOS (polycystic ovarian syndrome)      Vital Signs: BP (!) 140/100   Pulse 92   Temp 99.2 F (37.3 C)   Ht 5' 2 (1.575 m)   Wt 300 lb (136.1 kg)   SpO2 98%   BMI 54.87 kg/m    Review of Systems  Constitutional:  Positive for chills (at onset of sx) and fatigue. Negative for fever.  HENT:  Positive for congestion, ear pain (more pressure than pain, L>R), sinus pressure and sore throat. Negative for rhinorrhea and trouble swallowing.   Respiratory:  Positive for cough and chest tightness. Negative for shortness of breath.   Musculoskeletal:  Negative for myalgias and neck stiffness.  Neurological:  Positive for headaches.    Physical Exam Vitals reviewed.  Constitutional:      General: She is not in acute distress.    Appearance: She is not ill-appearing.     Comments: Tired appearing  HENT:     Head: Normocephalic.     Right Ear: Ear canal and external ear normal. Tympanic membrane is not injected, erythematous or bulging.     Left Ear: Ear canal and external ear normal.     Ears:     Comments: Moderate dry cerumen to left EAC which mostly obstructs view of left TM.  Right TM dull.    Nose: Mucosal edema and congestion present. No rhinorrhea.     Right Turbinates: Swollen.     Left Turbinates: Swollen.     Right Sinus: No maxillary sinus tenderness or frontal sinus tenderness.     Left Sinus: No maxillary sinus tenderness or frontal sinus tenderness.     Mouth/Throat:     Mouth: Mucous membranes are moist. No oral lesions.     Pharynx: No pharyngeal swelling or posterior oropharyngeal erythema.     Tonsils: No tonsillar exudate.  Cardiovascular:     Rate and Rhythm: Normal rate and regular rhythm.     Heart sounds: No murmur heard.    No friction rub. No gallop.  Pulmonary:     Effort: Pulmonary effort is normal. No respiratory distress.     Breath sounds: Normal breath sounds. No decreased  breath sounds, wheezing, rhonchi or rales.  Musculoskeletal:     Cervical back: Neck supple. No rigidity.  Lymphadenopathy:     Cervical: Cervical adenopathy (small anterior nodes, mildly tender) present.  Neurological:     Mental Status: She is alert.     Assessment/Plan: 1. Acute upper respiratory infection (Primary) 2. Mild intermittent asthma with acute exacerbation 3. Sinus pressure Viral versus bacterial infection with secondary asthma exacerbation. Advised continued supportive and symptomatic treatment next few days. Will give albuterol inhaler to use PRN cough, chest tightness or wheezing. Advised steroid nasal spray, inhaling steam in shower. Discussed occasional/sparing use of Sudafed at lowest possible dose when needed for severe/persistent congestion. Will send antibiotic to hold PRN new/worsening symptoms (i.e. fever, increased/persistent sinus pain or pressure, shortness of breath) or if symptoms not improving over next 4-5 days.  - albuterol (VENTOLIN HFA) 108 (90 Base)  MCG/ACT inhaler; Inhale 1-2 puffs into the lungs every 4 (four) hours as needed for wheezing or shortness of breath (or cough).  Dispense: 1 each; Refill: 0 - amoxicillin -clavulanate (AUGMENTIN ) 875-125 MG tablet; Take 1 tablet by mouth 2 (two) times daily for 10 days.  Dispense: 20 tablet; Refill: 0  Patient Instructions  -Rest and stay well hydrated (by drinking water and other liquids).  -Try Flonase/Fluticasone nasal spray, 2 sprays to each nostril once a day. -Use albuterol inhaler every 4-6 hours as needed for shortness of breath, wheezing, chest tightness or persistent cough. -Take over-the-counter medicines (i.e. Coricidin with guaifenesin/acetaminophen , Robitussin) to help relieve your symptoms. -Try inhaling steam in the shower or using saline nasal rinses once or twice daily. -For your sore throat/cough, use cough drops/throat lozenges, gargle warm salt water and/or drink warm liquids (like tea with  honey).  -You may fill the antibiotic and begin taking it if you develop new/worsening symptoms (i.e. fever, increased/persistent sinus pain or pressure, shortness of breath) or if symptoms do not improve as discussed with recommended treatments over the next 4-5 days.      General Counseling: Reginald verbalizes understanding of the findings of todays visit and plan of treatment. she has been encouraged to call the office with any questions or concerns that should arise related to todays visit.    Time spent:20 Minutes    Joen Arts PA-C Physician Assistant

## 2024-10-21 NOTE — Patient Instructions (Addendum)
-  Rest and stay well hydrated (by drinking water and other liquids).  -Try Flonase/Fluticasone nasal spray, 2 sprays to each nostril once a day. -Use albuterol inhaler every 4-6 hours as needed for shortness of breath, wheezing, chest tightness or persistent cough. -Take over-the-counter medicines (i.e. Coricidin with guaifenesin/acetaminophen , Robitussin) to help relieve your symptoms. -Try inhaling steam in the shower or using saline nasal rinses once or twice daily. -For your sore throat/cough, use cough drops/throat lozenges, gargle warm salt water and/or drink warm liquids (like tea with honey).  -You may fill the antibiotic and begin taking it if you develop new/worsening symptoms (i.e. fever, increased/persistent sinus pain or pressure, shortness of breath) or if symptoms do not improve as discussed with recommended treatments over the next 4-5 days.
